# Patient Record
Sex: Female | Born: 1965
Health system: Southern US, Community
[De-identification: ages and names within clinical notes are randomized; demographics above are authoritative.]

## PROBLEM LIST (undated history)

## (undated) DIAGNOSIS — F32A Depression, unspecified: Secondary | ICD-10-CM

---

## 1998-08-09 ENCOUNTER — Emergency Department (HOSPITAL_COMMUNITY): Admission: EM | Admit: 1998-08-09 | Discharge: 1998-08-09 | Payer: Self-pay | Admitting: Emergency Medicine

## 2000-04-10 ENCOUNTER — Other Ambulatory Visit: Admission: RE | Admit: 2000-04-10 | Discharge: 2000-04-10 | Payer: Self-pay | Admitting: Obstetrics and Gynecology

## 2001-10-22 ENCOUNTER — Other Ambulatory Visit: Admission: RE | Admit: 2001-10-22 | Discharge: 2001-10-22 | Payer: Self-pay | Admitting: Obstetrics and Gynecology

## 2002-03-08 ENCOUNTER — Encounter (HOSPITAL_COMMUNITY): Admission: RE | Admit: 2002-03-08 | Discharge: 2002-03-15 | Payer: Self-pay | Admitting: Obstetrics and Gynecology

## 2002-03-22 ENCOUNTER — Encounter (HOSPITAL_COMMUNITY): Admission: RE | Admit: 2002-03-22 | Discharge: 2002-03-31 | Payer: Self-pay | Admitting: Obstetrics and Gynecology

## 2002-04-01 ENCOUNTER — Inpatient Hospital Stay (HOSPITAL_COMMUNITY): Admission: AD | Admit: 2002-04-01 | Discharge: 2002-04-05 | Payer: Self-pay | Admitting: Obstetrics and Gynecology

## 2002-04-02 ENCOUNTER — Encounter (INDEPENDENT_AMBULATORY_CARE_PROVIDER_SITE_OTHER): Payer: Self-pay

## 2002-05-02 ENCOUNTER — Other Ambulatory Visit: Admission: RE | Admit: 2002-05-02 | Discharge: 2002-05-02 | Payer: Self-pay | Admitting: Obstetrics and Gynecology

## 2004-12-10 ENCOUNTER — Other Ambulatory Visit: Admission: RE | Admit: 2004-12-10 | Discharge: 2004-12-10 | Payer: Self-pay | Admitting: Obstetrics and Gynecology

## 2005-02-15 ENCOUNTER — Ambulatory Visit (HOSPITAL_COMMUNITY): Admission: RE | Admit: 2005-02-15 | Discharge: 2005-02-15 | Payer: Self-pay

## 2006-01-05 ENCOUNTER — Other Ambulatory Visit: Admission: RE | Admit: 2006-01-05 | Discharge: 2006-01-05 | Payer: Self-pay | Admitting: Obstetrics and Gynecology

## 2007-03-09 ENCOUNTER — Ambulatory Visit: Payer: Self-pay | Admitting: Psychiatry

## 2007-03-09 ENCOUNTER — Inpatient Hospital Stay (HOSPITAL_COMMUNITY): Admission: RE | Admit: 2007-03-09 | Discharge: 2007-03-10 | Payer: Self-pay | Admitting: Psychiatry

## 2007-03-09 ENCOUNTER — Emergency Department (HOSPITAL_COMMUNITY): Admission: EM | Admit: 2007-03-09 | Discharge: 2007-03-09 | Payer: Self-pay | Admitting: Emergency Medicine

## 2007-03-11 ENCOUNTER — Ambulatory Visit (HOSPITAL_COMMUNITY): Payer: Self-pay | Admitting: *Deleted

## 2007-04-08 ENCOUNTER — Ambulatory Visit (HOSPITAL_COMMUNITY): Payer: Self-pay | Admitting: *Deleted

## 2007-04-16 ENCOUNTER — Ambulatory Visit (HOSPITAL_COMMUNITY): Payer: Self-pay | Admitting: Marriage and Family Therapist

## 2007-04-22 ENCOUNTER — Ambulatory Visit (HOSPITAL_COMMUNITY): Payer: Self-pay | Admitting: Marriage and Family Therapist

## 2007-04-30 ENCOUNTER — Ambulatory Visit (HOSPITAL_COMMUNITY): Payer: Self-pay | Admitting: Marriage and Family Therapist

## 2007-05-11 ENCOUNTER — Ambulatory Visit (HOSPITAL_COMMUNITY): Payer: Self-pay | Admitting: *Deleted

## 2007-05-12 ENCOUNTER — Ambulatory Visit (HOSPITAL_COMMUNITY): Payer: Self-pay | Admitting: Marriage and Family Therapist

## 2007-05-26 ENCOUNTER — Ambulatory Visit (HOSPITAL_COMMUNITY): Payer: Self-pay | Admitting: Marriage and Family Therapist

## 2007-06-09 ENCOUNTER — Ambulatory Visit (HOSPITAL_COMMUNITY): Payer: Self-pay | Admitting: Marriage and Family Therapist

## 2007-07-29 ENCOUNTER — Ambulatory Visit (HOSPITAL_COMMUNITY): Payer: Self-pay | Admitting: Marriage and Family Therapist

## 2007-08-05 ENCOUNTER — Ambulatory Visit (HOSPITAL_COMMUNITY): Payer: Self-pay | Admitting: Marriage and Family Therapist

## 2007-08-17 ENCOUNTER — Ambulatory Visit (HOSPITAL_COMMUNITY): Payer: Self-pay | Admitting: *Deleted

## 2007-08-23 ENCOUNTER — Ambulatory Visit (HOSPITAL_COMMUNITY): Payer: Self-pay | Admitting: Marriage and Family Therapist

## 2007-08-30 ENCOUNTER — Ambulatory Visit (HOSPITAL_COMMUNITY): Payer: Self-pay | Admitting: Marriage and Family Therapist

## 2007-09-07 ENCOUNTER — Ambulatory Visit (HOSPITAL_COMMUNITY): Payer: Self-pay | Admitting: Marriage and Family Therapist

## 2007-09-21 ENCOUNTER — Ambulatory Visit (HOSPITAL_COMMUNITY): Payer: Self-pay | Admitting: Marriage and Family Therapist

## 2010-07-16 NOTE — Discharge Summary (Signed)
NAMENELIDA, MANDARINO NO.:  [id number]   MEDICAL RECORD NO.:  [id number]          PATIENT TYPE:  IPS   LOCATION:  0502                          FACILITY:  [facility]   PHYSICIAN:  Jasmine Pang, M.D. DATE OF BIRTH:  [birth year]   DATE OF ADMISSION:  03/09/2007  DATE OF DISCHARGE:  03/10/2007                               DISCHARGE SUMMARY   IDENTIFICATION:  This is a 45 year old married white female from  [place] who was admitted on a voluntary basis on March 09, 2007.   HISTORY OF PRESENT ILLNESS:  The patient described herself as being a  frustrated mother of a 74-year-old.  She states she had made a comment  that she could hurt her son, but states she would not do this.  She  feels her mother has overreacted and wanted her to be in the hospital.  Her mother feels she has probable bipolar disorder.  She denies she has  suicidal ideation or homicidal ideation.  She denies she would hurt her  son in any way.  She states she thought she was coming over to our unit  for parenting classes and has been very upset to see the severity of  pathology here.   PAST PSYCHIATRIC HISTORY:  None recently.  She did have a psychiatrist  when she was a young adult, revolving around conflict with her mother.   PAST MEDICAL HISTORY:  No acute medical problems.  She does state since  her C-section 4 years ago she has not been having regular periods, and  she wonders if she is going through menopause.   MEDICATIONS:  None.   ALLERGIES:  No known drug allergies.   SOCIAL HISTORY:  The patient is 45 years old as indicated above.  She  has been married for 11 years.  She had been dating her current husband  for 5 years before they married.  She states they have had a somewhat  conflicted relationship recently because he found out she was using  drugs and was upset about this.  She admits that she has been using some  alcohol and some marijuana.  She smokes less than a pack per day,  she  says.  She denies any other drug use.   FAMILY HISTORY:  Positive for depression and anxiety in several family  members on her mother's side.   PHYSICAL FINDINGS:  There were no acute physical or medical problems  noted.  Her physical exam was done at [facility] ED.  Temperature was  98.4, pulse 74, respirations 18, and blood pressure 134/85.  She is 5  feet 7 inches and weighs 127 pounds.   ADMISSION LABORATORY:  CBC was within normal limits.  Alcohol level was  less than 5.  CMET was within normal limits.   HOSPITAL COURSE:  As indicated above, the patient denied that she would  hurt her child or herself.  She states this was an overexaggerated  remark that had been taking out of context.  We did report her remark to  CPS due to its nature.  I did  not feel, however, that she was at risk of  harming her child or anyone.  Her mental status was improved from  admission status.  She was less depressed and less anxious.  There was  no suicidal or homicidal ideation.  No thoughts of self-injurious  behavior.  No auditory or visual hallucinations.  No paranoia or  delusions.  Thoughts were logical and goal-directed.  Thought content,  no predominant theme.  Cognitive was grossly within normal limits.  It  was felt the patient was safe to be discharged home.  She will see me in  the psychiatric clinic at [facility] tomorrow for continued evaluation  and possible medication management.  She was not started on any  medication here.   DISCHARGE DIAGNOSES:  AXIS I:  Mood disorder, not otherwise specified.  Rule out polysubstance abuse.  AXIS II:  None.  AXIS III:  None.  AXIS IV:  Moderate (problems with primary support group and burden of  psychiatric illness).  AXIS V:  Global assessment of functioning upon discharge was 50.  Global  assessment of functioning upon admission was 40.  Global assessment of  functioning highest past year was 60-65.   POST-HOSPITAL CARE PLANS:  There  are no specific activity level or  dietary restrictions.   POST-HOSPITAL APPOINTMENTS:  The patient will see me in the [facility] tomorrow (March 11, 2007) at 1:30  p.m. for further evaluation and treatment.   DISCHARGE MEDICATIONS:  None.      Jasmine Pang, M.D.  Electronically Signed     BHS/MEDQ  D:  03/10/2007  T:  03/11/2007  Job:  [id number]

## 2010-07-19 NOTE — Discharge Summary (Signed)
   [name], [name]                             ACCOUNT NO.:  [id number]   MEDICAL RECORD NO.:  [id number]                   PATIENT TYPE:  INP   LOCATION:  9117                                 FACILITY:  [facility]   PHYSICIAN:  Rudy Jew. Ashley Royalty, M.D.             DATE OF BIRTH:  1965-04-30   DATE OF ADMISSION:  04/02/2002  DATE OF DISCHARGE:  04/05/2002                                 DISCHARGE SUMMARY   DISCHARGE DIAGNOSES:  1. Intrauterine pregnancy at 39+ weeks gestation.  2. Premature rupture of membranes.  3. Smoker.  4. Chorioamnionitis.  5. Nonreassuring fetal heart tracing, necessitating cesarean section.  6. Anemia - probably secondary to blood loss.   OPERATIONS/SPECIAL PROCEDURES:  Primary low-transverse cesarean section.   CONSULTATIONS:  None.   DISCHARGE MEDICATIONS:  1. Percocet.  2. Hydrochlorothiazide.   HISTORY AND PHYSICAL:  This is a 45 year old gravida 2, para 0, AB 1 at 39+  weeks gestation.  Prenatal care was complicated by smoking.  The patient  presented to maternity admissions complaining of premature rupture of  membranes.   HOSPITAL COURSE:  The patient was admitted to [facility] in  [place].  Admission laboratory studies were drawn.  Initial cervical  examination revealed the cervix to be 2 cm dilated, 80% effaced, -1 station  vertex presentation.  The patient went on to labor.  She developed  chorioamnionitis and a nonreassuring fetal heart tracing.  Hence, she was  taken to the operating room and underwent primary low-transverse cesarean  section.  The procedure yielded a 6-pound-2-ounce female, Apgars 9 at 1 minute  and 9 at 5 minutes, sent the newborn nursery.  Delivery of newborn was by  Dr. Lonell Face.  The patient's postoperative course was benign.  She was  discharged on the third postpartum day afebrile, in a satisfactory  condition.   ESSENTIAL CLINICAL FINDINGS:  Hemoglobin and hematocrit on admission were  11.9 and 36.2,  respectively.  Repeat values obtained on 04/05/2002 were 7.6  and 22.9, respectively.  These latter values represented a rise from the  nadir two days before of 7.4 and 22.8, respectively.   DISPOSITION:  The patient is to return to [facility] in four to six  weeks for postpartum evaluation.                                               James A. Ashley Royalty, M.D.    JAM/MEDQ  D:  06/16/2002  T:  06/16/2002  Job:  [id number]

## 2010-07-19 NOTE — Op Note (Signed)
Ebony Wolfe, Ebony Wolfe                             ACCOUNT NO.:  [id number]   MEDICAL RECORD NO.:  [id number]                   PATIENT TYPE:  INP   LOCATION:  9117                                 [facility]   PHYSICIAN:  Rudy Jew. Ashley Royalty, M.D.             DATE OF BIRTH:  31-Mar-1965   DATE OF PROCEDURE:  04/02/2002  DATE OF DISCHARGE:                                 OPERATIVE REPORT   PREOPERATIVE DIAGNOSES:  1. Intrauterine pregnancy at term.  2. Premature rupture of membranes.  3. Chorioamnionitis.  4. Nonreassuring fetal heart tracing.   POSTOPERATIVE DIAGNOSES:  1. Intrauterine pregnancy at term.  2. Premature rupture of membranes.  3. Chorioamnionitis.  4. Nonreassuring fetal heart tracing.   PROCEDURE:  Primary low transverse cesarean section.   SURGEON:  Rudy Jew. Ashley Royalty, M.D.   ANESTHESIA:  Epidural.   FINDINGS:  A 6-pound 2-ounce female, Apgars 9 at one minute and 9 at five  minutes, sent to the newborn nursery.  Arterial cord pH 7.34.   ESTIMATED BLOOD LOSS:  800 cc.   COMPLICATIONS:  None.   PACKS AND DRAINS:  Foley.  Sponge, instrument, and needle counts reported as  correct x2.   PROCEDURE:  The patient was taken to the operating room and placed in the  dorsal supine position.  She was prepped and draped in the usual manner for  abdominal surgery.  A Foley catheter had been previously placed.  After  surgical levels of epidural anesthetic were verified, a Pfannenstiel  incision was made down to the level of the fascia.  The fascia was nicked  with a knife and incised transversely with Mayo scissors.  The underlying  rectus muscles were separated from the overlying fascia using sharp and  blunt dissection.  The rectus muscles were separated in the midline exposing  the peritoneum which was elevated with hemostats and entered atraumatically  with Metzenbaum scissors.  The incision was extended longitudinally.  The  uterus was identified and a bladder flap  created downsizing the anterior  uterine serosa and sharply and bluntly dissecting the bladder inferiorly.  It was held in place with a bladder blade.  The uterus was then entered  through a low transverse incision using sharp and blunt dissection.  The  amniotic fluid was noted to be clear.  The infant was delivered from the  vertex presentation in an atraumatic manner.  The infant was suctioned.  The  cord was triply clamped, cut, and the infant given immediately to the  awaiting pediatrics team.  There was no evidence of any nuchal cord.  Arterial cord pH was obtained from an isolated segment.  Regular cord blood  was obtained and placenta membranes were removed in their entirety and  submitted to pathology for histologic studies.  The uterus was then  exteriorized.  The uterus was then closed in two running layers of 1 Vicryl.  The first was running, locking layer.  The second was a running,  intermittently locking, and imbricating layer.  Hemostasis was noted.  The  uterus, fallopian tubes, and ovaries were inspected and found to be  otherwise normal.  They were returned to the abdominal cavity.  Copious  irrigation was accomplished.  Hemostasis was noted.  The fascia was then  closed with 0 Vicryl in a running fashion.  The skin was closed with  staples.  The patient tolerated the procedure extremely well and was  returned to the recovery room in good condition.                                               James A. Ashley Royalty, M.D.    JAM/MEDQ  D:  04/02/2002  T:  04/02/2002  Job:  [id number]

## 2010-11-21 LAB — CBC
HCT: 38
Hemoglobin: 12.8
MCHC: 33.6
MCV: 91.6
Platelets: 164
RBC: 4.15
RDW: 13.6
WBC: 7.8

## 2010-11-21 LAB — PREGNANCY, URINE: Preg Test, Ur: NEGATIVE

## 2010-11-21 LAB — RAPID URINE DRUG SCREEN, HOSP PERFORMED
Amphetamines: NOT DETECTED
Barbiturates: NOT DETECTED
Benzodiazepines: NOT DETECTED
Cocaine: NOT DETECTED
Opiates: NOT DETECTED
Tetrahydrocannabinol: POSITIVE — AB

## 2010-11-21 LAB — COMPREHENSIVE METABOLIC PANEL WITH GFR
ALT: 14
AST: 20
Albumin: 4
Alkaline Phosphatase: 78
BUN: 8
CO2: 30
Calcium: 9.5
Chloride: 106
Creatinine, Ser: 0.83
GFR calc Af Amer: >60
GFR calc non Af Amer: >60
Glucose, Bld: 89
Potassium: 4.2
Sodium: 143
Total Bilirubin: 1
Total Protein: 6.8

## 2010-11-21 LAB — ETHANOL: Alcohol, Ethyl (B): <5

## 2012-02-25 ENCOUNTER — Emergency Department (HOSPITAL_COMMUNITY)
Admission: EM | Admit: 2012-02-25 | Discharge: 2012-02-26 | Disposition: A | Discharge status: Home / Self Care | Payer: Self-pay | Attending: Emergency Medicine | Admitting: Emergency Medicine

## 2012-02-25 DIAGNOSIS — Z3202 Encounter for pregnancy test, result negative: Secondary | ICD-10-CM | POA: Insufficient documentation

## 2012-02-25 DIAGNOSIS — R55 Syncope and collapse: Secondary | ICD-10-CM | POA: Insufficient documentation

## 2012-02-25 NOTE — ED Notes (Addendum)
EMS called to home.  Found patient laying after un-witnessed syncopal episode out at her parents house. Patient denies any head pain.

## 2012-02-26 LAB — CBC WITH DIFFERENTIAL/PLATELET
Basophils Relative: 0 % (ref 0–1)
Eosinophils Absolute: 0.2 10*3/uL (ref 0.0–0.7)
Eosinophils Relative: 2 % (ref 0–5)
Hemoglobin: 13.7 g/dL (ref 12.0–15.0)
Lymphs Abs: 2.7 10*3/uL (ref 0.7–4.0)
MCH: 29.8 pg (ref 26.0–34.0)
MCHC: 32.5 g/dL (ref 30.0–36.0)
MCV: 91.7 fL (ref 78.0–100.0)
Monocytes Relative: 7 % (ref 3–12)
RBC: 4.6 MIL/uL (ref 3.87–5.11)

## 2012-02-26 LAB — URINALYSIS, ROUTINE W REFLEX MICROSCOPIC
Bilirubin Urine: NEGATIVE
Hgb urine dipstick: NEGATIVE
Ketones, ur: NEGATIVE mg/dL
Specific Gravity, Urine: 1.028 (ref 1.005–1.030)
Urobilinogen, UA: 1 mg/dL (ref 0.0–1.0)
pH: 7 (ref 5.0–8.0)

## 2012-02-26 LAB — BASIC METABOLIC PANEL
BUN: 19 mg/dL (ref 6–23)
Calcium: 9.6 mg/dL (ref 8.4–10.5)
Creatinine, Ser: 0.96 mg/dL (ref 0.50–1.10)
GFR calc non Af Amer: 71 mL/min — ABNORMAL LOW (ref >90)
Glucose, Bld: 91 mg/dL (ref 70–99)

## 2012-02-26 NOTE — ED Notes (Signed)
Patient is alert and oriented x3.  She was given DC instructions and follow up visit instructions.  Patient gave verbal understanding. She was DC ambulatory under her own power to home.  V/S stable.  He was not showing any signs of distress on DC 

## 2012-02-26 NOTE — ED Provider Notes (Signed)
History     CSN: [id number]  Arrival date & time 02/25/12  2355   First MD Initiated Contact with Patient 02/26/12 0019      Chief Complaint  Patient presents with  . Loss of Consciousness   HPI  History provided by the patient and family. Patient is a 46 year old female with no significant PMH who is visiting for the holidays. Patient reports that she began feeling warm and hot and stood up to walk to get a wet wash rate for her face. As she was serological interface she had a brief syncopal episode witnessed by family. Family reports that patient comfortable to the ground and did not respond being called twice. Family ran to get his grandmother and patient was coming around at that time. There was no reports of extremity movements or shaking. There was no urinary or fecal incontinence. There was no post syncopal confusion. Patient currently reports feeling well. Denies feeling any pain or reports of injury. She denied having any chest pain prior to episodes or shortness of breath. She does report having some alcoholic beverages earlier in the evening.    No past medical history on file.  No past surgical history on file.  No family history on file.  History  Substance Use Topics  . Smoking status: Not on file  . Smokeless tobacco: Not on file  . Alcohol Use: Not on file    OB History    No data available      Review of Systems  Constitutional: Negative for fever and chills.  Respiratory: Negative for cough and shortness of breath.   Cardiovascular: Negative for palpitations.  Gastrointestinal: Negative for nausea, vomiting and abdominal pain.  Neurological: Negative for light-headedness and headaches.  All other systems reviewed and are negative.    Allergies  Review of patient's allergies indicates no known allergies.  Home Medications   Current Outpatient Rx  Name  Route  Sig  Dispense  Refill  . FERROUS SULFATE 325 (65 FE) MG PO TABS   Oral   Take 325 mg  by mouth daily with breakfast.           BP 102/59  Pulse 74  Temp 97.8 F (36.6 C) (Oral)  Resp 15  SpO2 96%  Physical Exam  Nursing note and vitals reviewed. Constitutional: She is oriented to person, place, and time. She appears well-developed and well-nourished. No distress.  HENT:  Head: Normocephalic and atraumatic.  Eyes: Conjunctivae normal and EOM are normal. Pupils are equal, round, and reactive to light.  Neck: Normal range of motion. Neck supple.       No meningeal signs  Cardiovascular: Normal rate and regular rhythm.   No murmur heard. Pulmonary/Chest: Effort normal and breath sounds normal. No respiratory distress. She has no wheezes. She has no rales.  Abdominal: Soft. There is no tenderness. There is no rigidity, no rebound, no guarding, no CVA tenderness and no tenderness at McBurney's point.  Neurological: She is alert and oriented to person, place, and time. She has normal strength. No cranial nerve deficit or sensory deficit. Gait normal.  Skin: Skin is warm and dry. No rash noted.  Psychiatric: She has a normal mood and affect. Her behavior is normal.    ED Course  Procedures   Results for orders placed during the hospital encounter of 02/25/12  URINALYSIS, ROUTINE W REFLEX MICROSCOPIC      Component Value Range   Color, Urine YELLOW  YELLOW   APPearance CLOUDY (*)  CLEAR   Specific Gravity, Urine 1.028  1.005 - 1.030   pH 7.0  5.0 - 8.0   Glucose, UA NEGATIVE  NEGATIVE mg/dL   Hgb urine dipstick NEGATIVE  NEGATIVE   Bilirubin Urine NEGATIVE  NEGATIVE   Ketones, ur NEGATIVE  NEGATIVE mg/dL   Protein, ur NEGATIVE  NEGATIVE mg/dL   Urobilinogen, UA 1.0  0.0 - 1.0 mg/dL   Nitrite NEGATIVE  NEGATIVE   Leukocytes, UA NEGATIVE  NEGATIVE  CBC WITH DIFFERENTIAL      Component Value Range   WBC 9.6  4.0 - 10.5 K/uL   RBC 4.60  3.87 - 5.11 MIL/uL   Hemoglobin 13.7  12.0 - 15.0 g/dL   HCT 16.1  09.6 - 04.5 %   MCV 91.7  78.0 - 100.0 fL   MCH 29.8  26.0  - 34.0 pg   MCHC 32.5  30.0 - 36.0 g/dL   RDW 40.9  81.1 - 91.4 %   Platelets 177  150 - 400 K/uL   Neutrophils Relative 63  43 - 77 %   Neutro Abs 6.1  1.7 - 7.7 K/uL   Lymphocytes Relative 28  12 - 46 %   Lymphs Abs 2.7  0.7 - 4.0 K/uL   Monocytes Relative 7  3 - 12 %   Monocytes Absolute 0.7  0.1 - 1.0 K/uL   Eosinophils Relative 2  0 - 5 %   Eosinophils Absolute 0.2  0.0 - 0.7 K/uL   Basophils Relative 0  0 - 1 %   Basophils Absolute 0.0  0.0 - 0.1 K/uL  BASIC METABOLIC PANEL      Component Value Range   Sodium 140  135 - 145 mEq/L   Potassium 3.9  3.5 - 5.1 mEq/L   Chloride 103  96 - 112 mEq/L   CO2 26  19 - 32 mEq/L   Glucose, Bld 91  70 - 99 mg/dL   BUN 19  6 - 23 mg/dL   Creatinine, Ser 7.82  0.50 - 1.10 mg/dL   Calcium 9.6  8.4 - 95.6 mg/dL   GFR calc non Af Amer 71 (*) >90 mL/min   GFR calc Af Amer 82 (*) >90 mL/min  POCT PREGNANCY, URINE      Component Value Range   Preg Test, Ur NEGATIVE  NEGATIVE       1. Syncope       MDM  Patient seen and evaluated. Patient well-appearing without symptoms at this time. Denies any pain or injuries. Denies headache. Normal nonfocal neuro exam.   Labs and unremarkable. EKG also normal today. Symptoms were consistent with vasovagal type syncope with episode. Patient did report some alcohol use earlier in the evening. Patient not found to be significant orthostatic. I did discuss with patient options for further evaluation including CT scan however she has normal nonfocal neuro exam. No symptoms of headache. Low suspicion for [facility] or subdural head is atraumatic. Patient does not wish to have any further testing and is requesting to return home.       Date: 02/26/2012  Rate: 64  Rhythm: normal sinus rhythm  QRS Axis: normal  Intervals: normal  ST/T Wave abnormalities: normal  Conduction Disutrbances:none  Narrative Interpretation:   Old EKG Reviewed: none available    Angus Seller, [place] 02/26/12 2031

## 2012-03-02 NOTE — ED Provider Notes (Signed)
Medical screening examination/treatment/procedure(s) were performed by non-physician practitioner and as supervising physician I was immediately available for consultation/collaboration. Devoria Albe, MD, Armando Gang   Ward Givens, MD 03/02/12 [phone]

## 2017-08-25 ENCOUNTER — Ambulatory Visit (INDEPENDENT_AMBULATORY_CARE_PROVIDER_SITE_OTHER): Payer: Self-pay | Admitting: Nurse Practitioner

## 2017-08-25 VITALS — BP 90/65 | HR 55 | Temp 97.6°F | Resp 16 | Wt 124.6 lb

## 2017-08-25 DIAGNOSIS — Z0000 Encounter for general adult medical examination without abnormal findings: Secondary | ICD-10-CM

## 2017-08-25 NOTE — Patient Instructions (Addendum)
Health Maintenance, Female Adopting a healthy lifestyle and getting preventive care can go a long way to promote health and wellness. Talk with your health care provider about what schedule of regular examinations is right for you. This is a good chance for you to check in with your provider about disease prevention and staying healthy. In between checkups, there are plenty of things you can do on your own. Experts have done a lot of research about which lifestyle changes and preventive measures are most likely to keep you healthy. Ask your health care provider for more information. Weight and diet Eat a healthy diet  Be sure to include plenty of vegetables, fruits, low-fat dairy products, and lean protein.  Do not eat a lot of foods high in solid fats, added sugars, or salt.  Get regular exercise. This is one of the most important things you can do for your health. ? Most adults should exercise for at least 150 minutes each week. The exercise should increase your heart rate and make you sweat (moderate-intensity exercise). ? Most adults should also do strengthening exercises at least twice a week. This is in addition to the moderate-intensity exercise.  Maintain a healthy weight  Body mass index (BMI) is a measurement that can be used to identify possible weight problems. It estimates body fat based on height and weight. Your health care provider can help determine your BMI and help you achieve or maintain a healthy weight.  For females 20 years of age and older: ? A BMI below 18.5 is considered underweight. ? A BMI of 18.5 to 24.9 is normal. ? A BMI of 25 to 29.9 is considered overweight. ? A BMI of 30 and above is considered obese.  Watch levels of cholesterol and blood lipids  You should start having your blood tested for lipids and cholesterol at 50 years of age, then have this test every 5 years.  You may need to have your cholesterol levels checked more often if: ? Your lipid or  cholesterol levels are high. ? You are older than 50 years of age. ? You are at high risk for heart disease.  Cancer screening Lung Cancer  Lung cancer screening is recommended for adults 55-80 years old who are at high risk for lung cancer because of a history of smoking.  A yearly low-dose CT scan of the lungs is recommended for people who: ? Currently smoke. ? Have quit within the past 15 years. ? Have at least a 30-pack-year history of smoking. A pack year is smoking an average of one pack of cigarettes a day for 1 year.  Yearly screening should continue until it has been 15 years since you quit.  Yearly screening should stop if you develop a health problem that would prevent you from having lung cancer treatment.  Breast Cancer  Practice breast self-awareness. This means understanding how your breasts normally appear and feel.  It also means doing regular breast self-exams. Let your health care provider know about any changes, no matter how small.  If you are in your 20s or 30s, you should have a clinical breast exam (CBE) by a health care provider every 1-3 years as part of a regular health exam.  If you are 40 or older, have a CBE every year. Also consider having a breast X-ray (mammogram) every year.  If you have a family history of breast cancer, talk to your health care provider about genetic screening.  If you are at high risk   for breast cancer, talk to your health care provider about having an MRI and a mammogram every year.  Breast cancer gene (BRCA) assessment is recommended for women who have family members with BRCA-related cancers. BRCA-related cancers include: ? Breast. ? Ovarian. ? Tubal. ? Peritoneal cancers.  Results of the assessment will determine the need for genetic counseling and BRCA1 and BRCA2 testing.  Cervical Cancer Your health care provider may recommend that you be screened regularly for cancer of the pelvic organs (ovaries, uterus, and  vagina). This screening involves a pelvic examination, including checking for microscopic changes to the surface of your cervix (Pap test). You may be encouraged to have this screening done every 3 years, beginning at age 75.  For women ages 17-65, health care providers may recommend pelvic exams and Pap testing every 3 years, or they may recommend the Pap and pelvic exam, combined with testing for human papilloma virus (HPV), every 5 years. Some types of HPV increase your risk of cervical cancer. Testing for HPV may also be done on women of any age with unclear Pap test results.  Other health care providers may not recommend any screening for nonpregnant women who are considered low risk for pelvic cancer and who do not have symptoms. Ask your health care provider if a screening pelvic exam is right for you.  If you have had past treatment for cervical cancer or a condition that could lead to cancer, you need Pap tests and screening for cancer for at least 20 years after your treatment. If Pap tests have been discontinued, your risk factors (such as having a new sexual partner) need to be reassessed to determine if screening should resume. Some women have medical problems that increase the chance of getting cervical cancer. In these cases, your health care provider may recommend more frequent screening and Pap tests.  Colorectal Cancer  This type of cancer can be detected and often prevented.  Routine colorectal cancer screening usually begins at 50 years of age and continues through 50 years of age.  Your health care provider may recommend screening at an earlier age if you have risk factors for colon cancer.  Your health care provider may also recommend using home test kits to check for hidden blood in the stool.  A small camera at the end of a tube can be used to examine your colon directly (sigmoidoscopy or colonoscopy). This is done to check for the earliest forms of colorectal  cancer.  Routine screening usually begins at age 37.  Direct examination of the colon should be repeated every 5-10 years through 50 years of age. However, you may need to be screened more often if early forms of precancerous polyps or small growths are found.  Skin Cancer  Check your skin from head to toe regularly.  Tell your health care provider about any new moles or changes in moles, especially if there is a change in a mole's shape or color.  Also tell your health care provider if you have a mole that is larger than the size of a pencil eraser.  Always use sunscreen. Apply sunscreen liberally and repeatedly throughout the day.  Protect yourself by wearing long sleeves, pants, a wide-brimmed hat, and sunglasses whenever you are outside.  Heart disease, diabetes, and high blood pressure  High blood pressure causes heart disease and increases the risk of stroke. High blood pressure is more likely to develop in: ? People who have blood pressure in the high end of  the normal range (130-139/85-89 mm Hg). ? People who are overweight or obese. ? People who are African American.  If you are 80-89 years of age, have your blood pressure checked every 3-5 years. If you are 43 years of age or older, have your blood pressure checked every year. You should have your blood pressure measured twice-once when you are at a hospital or clinic, and once when you are not at a hospital or clinic. Record the average of the two measurements. To check your blood pressure when you are not at a hospital or clinic, you can use: ? An automated blood pressure machine at a pharmacy. ? A home blood pressure monitor.  If you are between 36 years and 47 years old, ask your health care provider if you should take aspirin to prevent strokes.  Have regular diabetes screenings. This involves taking a blood sample to check your fasting blood sugar level. ? If you are at a normal weight and have a low risk for diabetes,  have this test once every three years after 50 years of age. ? If you are overweight and have a high risk for diabetes, consider being tested at a younger age or more often. Preventing infection Hepatitis B  If you have a higher risk for hepatitis B, you should be screened for this virus. You are considered at high risk for hepatitis B if: ? You were born in a country where hepatitis B is common. Ask your health care provider which countries are considered high risk. ? Your parents were born in a high-risk country, and you have not been immunized against hepatitis B (hepatitis B vaccine). ? You have HIV or AIDS. ? You use needles to inject street drugs. ? You live with someone who has hepatitis B. ? You have had sex with someone who has hepatitis B. ? You get hemodialysis treatment. ? You take certain medicines for conditions, including cancer, organ transplantation, and autoimmune conditions.  Hepatitis C  Blood testing is recommended for: ? Everyone born from [birth year] through 1963. ? Anyone with known risk factors for hepatitis C.  Sexually transmitted infections (STIs)  You should be screened for sexually transmitted infections (STIs) including gonorrhea and chlamydia if: ? You are sexually active and are younger than 50 years of age. ? You are older than 50 years of age and your health care provider tells you that you are at risk for this type of infection. ? Your sexual activity has changed since you were last screened and you are at an increased risk for chlamydia or gonorrhea. Ask your health care provider if you are at risk.  If you do not have HIV, but are at risk, it may be recommended that you take a prescription medicine daily to prevent HIV infection. This is called pre-exposure prophylaxis (PrEP). You are considered at risk if: ? You are sexually active and do not regularly use condoms or know the HIV status of your partner(s). ? You take drugs by injection. ? You are  sexually active with a partner who has HIV.  Talk with your health care provider about whether you are at high risk of being infected with HIV. If you choose to begin PrEP, you should first be tested for HIV. You should then be tested every 3 months for as long as you are taking PrEP. Pregnancy  If you are premenopausal and you may become pregnant, ask your health care provider about preconception counseling.  If you may become  pregnant, take 400 to 800 micrograms (mcg) of folic acid every day.  If you want to prevent pregnancy, talk to your health care provider about birth control (contraception). Osteoporosis and menopause  Osteoporosis is a disease in which the bones lose minerals and strength with aging. This can result in serious bone fractures. Your risk for osteoporosis can be identified using a bone density scan.  If you are 39 years of age or older, or if you are at risk for osteoporosis and fractures, ask your health care provider if you should be screened.  Ask your health care provider whether you should take a calcium or vitamin D supplement to lower your risk for osteoporosis.  Menopause may have certain physical symptoms and risks.  Hormone replacement therapy may reduce some of these symptoms and risks. Talk to your health care provider about whether hormone replacement therapy is right for you. Follow these instructions at home:  Schedule regular health, dental, and eye exams.  Stay current with your immunizations.  Do not use any tobacco products including cigarettes, chewing tobacco, or electronic cigarettes.  If you are pregnant, do not drink alcohol.  If you are breastfeeding, limit how much and how often you drink alcohol.  Limit alcohol intake to no more than 1 drink per day for nonpregnant women. One drink equals 12 ounces of beer, 5 ounces of wine, or 1 ounces of hard liquor.  Do not use street drugs.  Do not share needles.  Ask your health care  provider for help if you need support or information about quitting drugs.  Tell your health care provider if you often feel depressed.  Tell your health care provider if you have ever been abused or do not feel safe at home. This information is not intended to replace advice given to you by your health care provider. Make sure you discuss any questions you have with your health care provider. Document Released: 09/02/2010 Document Revised: 07/26/2015 Document Reviewed: 11/21/2014 Elsevier Interactive Patient Education  2018 [place] Years, Female Preventive care refers to lifestyle choices and visits with your health care provider that can promote health and wellness. What does preventive care include?  A yearly physical exam. This is also called an annual well check.  Dental exams once or twice a year.  Routine eye exams. Ask your health care provider how often you should have your eyes checked.  Personal lifestyle choices, including: ? Daily care of your teeth and gums. ? Regular physical activity. ? Eating a healthy diet. ? Avoiding tobacco and drug use. ? Limiting alcohol use. ? Practicing safe sex. ? Taking low-dose aspirin daily starting at age 34. ? Taking vitamin and mineral supplements as recommended by your health care provider. What happens during an annual well check? The services and screenings done by your health care provider during your annual well check will depend on your age, overall health, lifestyle risk factors, and family history of disease. Counseling Your health care provider may ask you questions about your:  Alcohol use.  Tobacco use.  Drug use.  Emotional well-being.  Home and relationship well-being.  Sexual activity.  Eating habits.  Work and work Statistician.  Method of birth control.  Menstrual cycle.  Pregnancy history.  Screening You may have the following tests or measurements:  Height, weight,  and BMI.  Blood pressure.  Lipid and cholesterol levels. These may be checked every 5 years, or more frequently if you are over 50 years  old.  Skin check.  Lung cancer screening. You may have this screening every year starting at age 101 if you have a 30-pack-year history of smoking and currently smoke or have quit within the past 15 years.  Fecal occult blood test (FOBT) of the stool. You may have this test every year starting at age 60.  Flexible sigmoidoscopy or colonoscopy. You may have a sigmoidoscopy every 5 years or a colonoscopy every 10 years starting at age 73.  Hepatitis C blood test.  Hepatitis B blood test.  Sexually transmitted disease (STD) testing.  Diabetes screening. This is done by checking your blood sugar (glucose) after you have not eaten for a while (fasting). You may have this done every 1-3 years.  Mammogram. This may be done every 1-2 years. Talk to your health care provider about when you should start having regular mammograms. This may depend on whether you have a family history of breast cancer.  BRCA-related cancer screening. This may be done if you have a family history of breast, ovarian, tubal, or peritoneal cancers.  Pelvic exam and Pap test. This may be done every 3 years starting at age 66. Starting at age 10, this may be done every 5 years if you have a Pap test in combination with an HPV test.  Bone density scan. This is done to screen for osteoporosis. You may have this scan if you are at high risk for osteoporosis.  Discuss your test results, treatment options, and if necessary, the need for more tests with your health care provider. Vaccines Your health care provider may recommend certain vaccines, such as:  Influenza vaccine. This is recommended every year.  Tetanus, diphtheria, and acellular pertussis (Tdap, Td) vaccine. You may need a Td booster every 10 years.  Varicella vaccine. You may need this if you have not been  vaccinated.  Zoster vaccine. You may need this after age 21.  Measles, mumps, and rubella (MMR) vaccine. You may need at least one dose of MMR if you were born in 1957 or later. You may also need a second dose.  Pneumococcal 13-valent conjugate (PCV13) vaccine. You may need this if you have certain conditions and were not previously vaccinated.  Pneumococcal polysaccharide (PPSV23) vaccine. You may need one or two doses if you smoke cigarettes or if you have certain conditions.  Meningococcal vaccine. You may need this if you have certain conditions.  Hepatitis A vaccine. You may need this if you have certain conditions or if you travel or work in places where you may be exposed to hepatitis A.  Hepatitis B vaccine. You may need this if you have certain conditions or if you travel or work in places where you may be exposed to hepatitis B.  Haemophilus influenzae type b (Hib) vaccine. You may need this if you have certain conditions.  Talk to your health care provider about which screenings and vaccines you need and how often you need them. This information is not intended to replace advice given to you by your health care provider. Make sure you discuss any questions you have with your health care provider. Document Released: 03/16/2015 Document Revised: 11/07/2015 Document Reviewed: 12/19/2014 Elsevier Interactive Patient Education  2018 [place].  Preventing High Cholesterol Cholesterol is a waxy, fat-like substance that your body needs in small amounts. Your liver makes all the cholesterol that your body needs. Having high cholesterol (hypercholesterolemia) increases your risk for heart disease and stroke. Extra (excess) cholesterol comes from the food you  eat, such as animal-based fat (saturated fat) from meat and some dairy products. High cholesterol can often be prevented with diet and lifestyle changes. If you already have high cholesterol, you can control it with diet and  lifestyle changes, as well as medicine. What nutrition changes can be made?  Eat less saturated fat. Foods that contain saturated fat include red meat and some dairy products.  Avoid processed meats, like bacon and lunch meats.  Avoid trans fats, which are found in margarine and some baked goods.  Avoid foods and beverages that have added sugars.  Eat more fruits, vegetables, and whole grains.  Choose healthy sources of protein, such as fish, poultry, and nuts.  Choose healthy sources of fat, such as: ? Nuts. ? Vegetable oils, especially olive oil. ? Fish that have healthy fats (omega-3 fatty acids), such as mackerel or salmon. What lifestyle changes can be made?  Lose weight if you are overweight. Losing 5-10 lb (2.3-4.5 kg) can help prevent or control high cholesterol and reduce your risk for diabetes and high blood pressure. Ask your health care provider to help you with a diet and exercise plan to safely lose weight.  Get enough exercise. Do at least 150 minutes of moderate-intensity exercise each week. ? You could do this in short exercise sessions several times a day, or you could do longer exercise sessions a few times a week. For example, you could take a brisk 10-minute walk or bike ride, 3 times a day, for 5 days a week.  Do not smoke. If you need help quitting, ask your health care provider.  Limit your alcohol intake. If you drink alcohol, limit alcohol intake to no more than 1 drink a day for nonpregnant women and 2 drinks a day for men. One drink equals 12 oz of beer, 5 oz of wine, or 1 oz of hard liquor. Why are these changes important? If you have high cholesterol, deposits (plaques) may build up on the walls of your blood vessels. Plaques make the arteries narrower and stiffer, which can restrict or block blood flow and cause blood clots to form. This greatly increases your risk for heart attack and stroke. Making diet and lifestyle changes can reduce your risk for  these life-threatening conditions. What can I do to lower my risk?  Manage your risk factors for high cholesterol. Talk with your health care provider about all of your risk factors and how to lower your risk.  Manage other conditions that you have, such as diabetes or high blood pressure (hypertension).  Have your cholesterol checked at regular intervals.  Keep all follow-up visits as told by your health care provider. This is important. How is this treated? In addition to diet and lifestyle changes, your health care provider may recommend medicines to help lower cholesterol, such as a medicine to reduce the amount of cholesterol made in your liver. You may need medicine if:  Diet and lifestyle changes do not lower your cholesterol enough.  You have high cholesterol and other risk factors for heart disease or stroke.  Take over-the-counter and prescription medicines only as told by your health care provider. Where to find more information:  American Heart Association: ThisTune.com.pt.jsp  National Heart, Lung, and Blood Institute: FrenchToiletries.com.cy Summary  High cholesterol increases your risk for heart disease and stroke. By keeping your cholesterol level low, you can reduce your risk for these conditions.  Diet and lifestyle changes are the most important steps in preventing high cholesterol.  Work  with your health care provider to manage your risk factors, and have your blood tested regularly. This information is not intended to replace advice given to you by your health care provider. Make sure you discuss any questions you have with your health care provider. Document Released: 03/04/2015 Document Revised: 10/27/2015 Document Reviewed: 10/27/2015 Elsevier Interactive Patient Education  2018 [place].  Preventing Hypertension Hypertension, commonly  called high blood pressure, is when the force of blood pumping through the arteries is too strong. Arteries are blood vessels that carry blood from the heart throughout the body. Over time, hypertension can damage the arteries and decrease blood flow to important parts of the body, including the brain, heart, and kidneys. Often, hypertension does not cause symptoms until blood pressure is very high. For this reason, it is important to have your blood pressure checked on a regular basis. Hypertension can often be prevented with diet and lifestyle changes. If you already have hypertension, you can control it with diet and lifestyle changes, as well as medicine. What nutrition changes can be made? Maintain a healthy diet. This includes:  Eating less salt (sodium). Ask your health care provider how much sodium is safe for you to have. The general recommendation is to consume less than 1 tsp (2,300 mg) of sodium a day. ? Do not add salt to your food. ? Choose low-sodium options when grocery shopping and eating out.  Limiting fats in your diet. You can do this by eating low-fat or fat-free dairy products and by eating less red meat.  Eating more fruits, vegetables, and whole grains. Make a goal to eat: ? 1-2 cups of fresh fruits and vegetables each day. ? 3-4 servings of whole grains each day.  Avoiding foods and beverages that have added sugars.  Eating fish that contain healthy fats (omega-3 fatty acids), such as mackerel or salmon.  If you need help putting together a healthy eating plan, try the DASH diet. This diet is high in fruits, vegetables, and whole grains. It is low in sodium, red meat, and added sugars. DASH stands for Dietary Approaches to Stop Hypertension. What lifestyle changes can be made?  Lose weight if you are overweight. Losing just 3?5% of your body weight can help prevent or control hypertension. ? For example, if your present weight is 200 lb (91 kg), a loss of 3-5% of your  weight means losing 6-10 lb (2.7-4.5 kg). ? Ask your health care provider to help you with a diet and exercise plan to safely lose weight.  Get enough exercise. Do at least 150 minutes of moderate-intensity exercise each week. ? You could do this in short exercise sessions several times a day, or you could do longer exercise sessions a few times a week. For example, you could take a brisk 10-minute walk or bike ride, 3 times a day, for 5 days a week.  Find ways to reduce stress, such as exercising, meditating, listening to music, or taking a yoga class. If you need help reducing stress, ask your health care provider.  Do not smoke. This includes e-cigarettes. Chemicals in tobacco and nicotine products raise your blood pressure each time you smoke. If you need help quitting, ask your health care provider.  Avoid alcohol. If you drink alcohol, limit alcohol intake to no more than 1 drink a day for nonpregnant women and 2 drinks a day for men. One drink equals 12 oz of beer, 5 oz of wine, or 1 oz of hard liquor. Why  are these changes important? Diet and lifestyle changes can help you prevent hypertension, and they may make you feel better overall and improve your quality of life. If you have hypertension, making these changes will help you control it and help prevent major complications, such as:  Hardening and narrowing of arteries that supply blood to: ? Your heart. This can cause a heart attack. ? Your brain. This can cause a stroke. ? Your kidneys. This can cause kidney failure.  Stress on your heart muscle, which can cause heart failure.  What can I do to lower my risk?  Work with your health care provider to make a hypertension prevention plan that works for you. Follow your plan and keep all follow-up visits as told by your health care provider.  Learn how to check your blood pressure at home. Make sure that you know your personal target blood pressure, as told by your health care  provider. How is this treated? In addition to diet and lifestyle changes, your health care provider may recommend medicines to help lower your blood pressure. You may need to try a few different medicines to find what works best for you. You also may need to take more than one medicine. Take over-the-counter and prescription medicines only as told by your health care provider. Where to find support: Your health care provider can help you prevent hypertension and help you keep your blood pressure at a healthy level. Your local hospital or your community may also provide support services and prevention programs. The American Heart Association offers an online support network at: CheapBootlegs.com.cy Where to find more information: Learn more about hypertension from:  National Heart, Lung, and Blood Institute: ElectronicHangman.is  Centers for Disease Control and Prevention: https://ingram.com/  American Academy of Family Physicians: http://familydoctor.org/familydoctor/en/diseases-conditions/high-blood-pressure.printerview.all.html  Learn more about the DASH diet from:  [place] [place]: https://www.reyes.com/  Contact a health care provider if:  You think you are having a reaction to medicines you have taken.  You have recurrent headaches or feel dizzy.  You have swelling in your ankles.  You have trouble with your vision. Summary  Hypertension often does not cause any symptoms until blood pressure is very high. It is important to get your blood pressure checked regularly.  Diet and lifestyle changes are the most important steps in preventing hypertension.  By keeping your blood pressure in a healthy range, you can prevent complications like heart attack, heart failure, stroke, and kidney failure.  Work with your health care provider to make a hypertension  prevention plan that works for you. This information is not intended to replace advice given to you by your health care provider. Make sure you discuss any questions you have with your health care provider. Document Released: 03/04/2015 Document Revised: 10/29/2015 Document Reviewed: 10/29/2015 Elsevier Interactive Patient Education  2018 [place] Years, Female Preventive care refers to lifestyle choices and visits with your health care provider that can promote health and wellness. What does preventive care include?  A yearly physical exam. This is also called an annual well check.  Dental exams once or twice a year.  Routine eye exams. Ask your health care provider how often you should have your eyes checked.  Personal lifestyle choices, including: ? Daily care of your teeth and gums. ? Regular physical activity. ? Eating a healthy diet. ? Avoiding tobacco and drug use. ? Limiting alcohol use. ? Practicing safe sex. ? Taking low-dose aspirin daily starting at  age 79. ? Taking vitamin and mineral supplements as recommended by your health care provider. What happens during an annual well check? The services and screenings done by your health care provider during your annual well check will depend on your age, overall health, lifestyle risk factors, and family history of disease. Counseling Your health care provider may ask you questions about your:  Alcohol use.  Tobacco use.  Drug use.  Emotional well-being.  Home and relationship well-being.  Sexual activity.  Eating habits.  Work and work Statistician.  Method of birth control.  Menstrual cycle.  Pregnancy history.  Screening You may have the following tests or measurements:  Height, weight, and BMI.  Blood pressure.  Lipid and cholesterol levels. These may be checked every 5 years, or more frequently if you are over 21 years old.  Skin check.  Lung cancer screening. You may  have this screening every year starting at age 52 if you have a 30-pack-year history of smoking and currently smoke or have quit within the past 15 years.  Fecal occult blood test (FOBT) of the stool. You may have this test every year starting at age 6.  Flexible sigmoidoscopy or colonoscopy. You may have a sigmoidoscopy every 5 years or a colonoscopy every 10 years starting at age 8.  Hepatitis C blood test.  Hepatitis B blood test.  Sexually transmitted disease (STD) testing.  Diabetes screening. This is done by checking your blood sugar (glucose) after you have not eaten for a while (fasting). You may have this done every 1-3 years.  Mammogram. This may be done every 1-2 years. Talk to your health care provider about when you should start having regular mammograms. This may depend on whether you have a family history of breast cancer.  BRCA-related cancer screening. This may be done if you have a family history of breast, ovarian, tubal, or peritoneal cancers.  Pelvic exam and Pap test. This may be done every 3 years starting at age 55. Starting at age 72, this may be done every 5 years if you have a Pap test in combination with an HPV test.  Bone density scan. This is done to screen for osteoporosis. You may have this scan if you are at high risk for osteoporosis.  Discuss your test results, treatment options, and if necessary, the need for more tests with your health care provider. Vaccines Your health care provider may recommend certain vaccines, such as:  Influenza vaccine. This is recommended every year.  Tetanus, diphtheria, and acellular pertussis (Tdap, Td) vaccine. You may need a Td booster every 10 years.  Varicella vaccine. You may need this if you have not been vaccinated.  Zoster vaccine. You may need this after age 31.  Measles, mumps, and rubella (MMR) vaccine. You may need at least one dose of MMR if you were born in 1957 or later. You may also need a second  dose.  Pneumococcal 13-valent conjugate (PCV13) vaccine. You may need this if you have certain conditions and were not previously vaccinated.  Pneumococcal polysaccharide (PPSV23) vaccine. You may need one or two doses if you smoke cigarettes or if you have certain conditions.  Meningococcal vaccine. You may need this if you have certain conditions.  Hepatitis A vaccine. You may need this if you have certain conditions or if you travel or work in places where you may be exposed to hepatitis A.  Hepatitis B vaccine. You may need this if you have certain conditions or if you  travel or work in places where you may be exposed to hepatitis B.  Haemophilus influenzae type b (Hib) vaccine. You may need this if you have certain conditions.  Talk to your health care provider about which screenings and vaccines you need and how often you need them. This information is not intended to replace advice given to you by your health care provider. Make sure you discuss any questions you have with your health care provider. Document Released: 03/16/2015 Document Revised: 11/07/2015 Document Reviewed: 12/19/2014 Elsevier Interactive Patient Education  2018 [place] Years, Female Preventive care refers to lifestyle choices and visits with your health care provider that can promote health and wellness. What does preventive care include?  A yearly physical exam. This is also called an annual well check.  Dental exams once or twice a year.  Routine eye exams. Ask your health care provider how often you should have your eyes checked.  Personal lifestyle choices, including: ? Daily care of your teeth and gums. ? Regular physical activity. ? Eating a healthy diet. ? Avoiding tobacco and drug use. ? Limiting alcohol use. ? Practicing safe sex. ? Taking low-dose aspirin daily starting at age 13. ? Taking vitamin and mineral supplements as recommended by your health care  provider. What happens during an annual well check? The services and screenings done by your health care provider during your annual well check will depend on your age, overall health, lifestyle risk factors, and family history of disease. Counseling Your health care provider may ask you questions about your:  Alcohol use.  Tobacco use.  Drug use.  Emotional well-being.  Home and relationship well-being.  Sexual activity.  Eating habits.  Work and work Statistician.  Method of birth control.  Menstrual cycle.  Pregnancy history.  Screening You may have the following tests or measurements:  Height, weight, and BMI.  Blood pressure.  Lipid and cholesterol levels. These may be checked every 5 years, or more frequently if you are over 81 years old.  Skin check.  Lung cancer screening. You may have this screening every year starting at age 69 if you have a 30-pack-year history of smoking and currently smoke or have quit within the past 15 years.  Fecal occult blood test (FOBT) of the stool. You may have this test every year starting at age 37.  Flexible sigmoidoscopy or colonoscopy. You may have a sigmoidoscopy every 5 years or a colonoscopy every 10 years starting at age 60.  Hepatitis C blood test.  Hepatitis B blood test.  Sexually transmitted disease (STD) testing.  Diabetes screening. This is done by checking your blood sugar (glucose) after you have not eaten for a while (fasting). You may have this done every 1-3 years.  Mammogram. This may be done every 1-2 years. Talk to your health care provider about when you should start having regular mammograms. This may depend on whether you have a family history of breast cancer.  BRCA-related cancer screening. This may be done if you have a family history of breast, ovarian, tubal, or peritoneal cancers.  Pelvic exam and Pap test. This may be done every 3 years starting at age 66. Starting at age 61, this may be  done every 5 years if you have a Pap test in combination with an HPV test.  Bone density scan. This is done to screen for osteoporosis. You may have this scan if you are at high risk for osteoporosis.  Discuss your test  results, treatment options, and if necessary, the need for more tests with your health care provider. Vaccines Your health care provider may recommend certain vaccines, such as:  Influenza vaccine. This is recommended every year.  Tetanus, diphtheria, and acellular pertussis (Tdap, Td) vaccine. You may need a Td booster every 10 years.  Varicella vaccine. You may need this if you have not been vaccinated.  Zoster vaccine. You may need this after age 12.  Measles, mumps, and rubella (MMR) vaccine. You may need at least one dose of MMR if you were born in 1957 or later. You may also need a second dose.  Pneumococcal 13-valent conjugate (PCV13) vaccine. You may need this if you have certain conditions and were not previously vaccinated.  Pneumococcal polysaccharide (PPSV23) vaccine. You may need one or two doses if you smoke cigarettes or if you have certain conditions.  Meningococcal vaccine. You may need this if you have certain conditions.  Hepatitis A vaccine. You may need this if you have certain conditions or if you travel or work in places where you may be exposed to hepatitis A.  Hepatitis B vaccine. You may need this if you have certain conditions or if you travel or work in places where you may be exposed to hepatitis B.  Haemophilus influenzae type b (Hib) vaccine. You may need this if you have certain conditions.  Talk to your health care provider about which screenings and vaccines you need and how often you need them. This information is not intended to replace advice given to you by your health care provider. Make sure you discuss any questions you have with your health care provider. Document Released: 03/16/2015 Document Revised: 11/07/2015 Document  Reviewed: 12/19/2014 Elsevier Interactive Patient Education  2018 [place] Years, Female Preventive care refers to lifestyle choices and visits with your health care provider that can promote health and wellness. What does preventive care include?  A yearly physical exam. This is also called an annual well check.  Dental exams once or twice a year.  Routine eye exams. Ask your health care provider how often you should have your eyes checked.  Personal lifestyle choices, including: ? Daily care of your teeth and gums. ? Regular physical activity. ? Eating a healthy diet. ? Avoiding tobacco and drug use. ? Limiting alcohol use. ? Practicing safe sex. ? Taking low-dose aspirin daily starting at age 2. ? Taking vitamin and mineral supplements as recommended by your health care provider. What happens during an annual well check? The services and screenings done by your health care provider during your annual well check will depend on your age, overall health, lifestyle risk factors, and family history of disease. Counseling Your health care provider may ask you questions about your:  Alcohol use.  Tobacco use.  Drug use.  Emotional well-being.  Home and relationship well-being.  Sexual activity.  Eating habits.  Work and work Statistician.  Method of birth control.  Menstrual cycle.  Pregnancy history.  Screening You may have the following tests or measurements:  Height, weight, and BMI.  Blood pressure.  Lipid and cholesterol levels. These may be checked every 5 years, or more frequently if you are over 97 years old.  Skin check.  Lung cancer screening. You may have this screening every year starting at age 81 if you have a 30-pack-year history of smoking and currently smoke or have quit within the past 15 years.  Fecal occult blood test (FOBT) of the  stool. You may have this test every year starting at age 49.  Flexible  sigmoidoscopy or colonoscopy. You may have a sigmoidoscopy every 5 years or a colonoscopy every 10 years starting at age 37.  Hepatitis C blood test.  Hepatitis B blood test.  Sexually transmitted disease (STD) testing.  Diabetes screening. This is done by checking your blood sugar (glucose) after you have not eaten for a while (fasting). You may have this done every 1-3 years.  Mammogram. This may be done every 1-2 years. Talk to your health care provider about when you should start having regular mammograms. This may depend on whether you have a family history of breast cancer.  BRCA-related cancer screening. This may be done if you have a family history of breast, ovarian, tubal, or peritoneal cancers.  Pelvic exam and Pap test. This may be done every 3 years starting at age 59. Starting at age 19, this may be done every 5 years if you have a Pap test in combination with an HPV test.  Bone density scan. This is done to screen for osteoporosis. You may have this scan if you are at high risk for osteoporosis.  Discuss your test results, treatment options, and if necessary, the need for more tests with your health care provider. Vaccines Your health care provider may recommend certain vaccines, such as:  Influenza vaccine. This is recommended every year.  Tetanus, diphtheria, and acellular pertussis (Tdap, Td) vaccine. You may need a Td booster every 10 years.  Varicella vaccine. You may need this if you have not been vaccinated.  Zoster vaccine. You may need this after age 47.  Measles, mumps, and rubella (MMR) vaccine. You may need at least one dose of MMR if you were born in 1957 or later. You may also need a second dose.  Pneumococcal 13-valent conjugate (PCV13) vaccine. You may need this if you have certain conditions and were not previously vaccinated.  Pneumococcal polysaccharide (PPSV23) vaccine. You may need one or two doses if you smoke cigarettes or if you have certain  conditions.  Meningococcal vaccine. You may need this if you have certain conditions.  Hepatitis A vaccine. You may need this if you have certain conditions or if you travel or work in places where you may be exposed to hepatitis A.  Hepatitis B vaccine. You may need this if you have certain conditions or if you travel or work in places where you may be exposed to hepatitis B.  Haemophilus influenzae type b (Hib) vaccine. You may need this if you have certain conditions.  Talk to your health care provider about which screenings and vaccines you need and how often you need them. This information is not intended to replace advice given to you by your health care provider. Make sure you discuss any questions you have with your health care provider. Document Released: 03/16/2015 Document Revised: 11/07/2015 Document Reviewed: 12/19/2014 Elsevier Interactive Patient Education  2018 [employer].  Steps to Quit Smoking Smoking tobacco can be harmful to your health and can affect almost every organ in your body. Smoking puts you, and those around you, at risk for developing many serious chronic diseases. Quitting smoking is difficult, but it is one of the best things that you can do for your health. It is never too late to quit. What are the benefits of quitting smoking? When you quit smoking, you lower your risk of developing serious diseases and conditions, such as:  Lung cancer or lung disease,  such as COPD.  Heart disease.  Stroke.  Heart attack.  Infertility.  Osteoporosis and bone fractures.  Additionally, symptoms such as coughing, wheezing, and shortness of breath may get better when you quit. You may also find that you get sick less often because your body is stronger at fighting off colds and infections. If you are pregnant, quitting smoking can help to reduce your chances of having a baby of low birth weight. How do I get ready to quit? When you decide to quit smoking, create a  plan to make sure that you are successful. Before you quit:  Pick a date to quit. Set a date within the next two weeks to give you time to prepare.  Write down the reasons why you are quitting. Keep this list in places where you will see it often, such as on your bathroom mirror or in your car or wallet.  Identify the people, places, things, and activities that make you want to smoke (triggers) and avoid them. Make sure to take these actions: ? Throw away all cigarettes at home, at work, and in your car. ? Throw away smoking accessories, such as Scientist, research (medical). ? Clean your car and make sure to empty the ashtray. ? Clean your home, including curtains and carpets.  Tell your family, friends, and coworkers that you are quitting. Support from your loved ones can make quitting easier.  Talk with your health care provider about your options for quitting smoking.  Find out what treatment options are covered by your health insurance.  What strategies can I use to quit smoking? Talk with your healthcare provider about different strategies to quit smoking. Some strategies include:  Quitting smoking altogether instead of gradually lessening how much you smoke over a period of time. Research shows that quitting "cold [place]" is more successful than gradually quitting.  Attending in-person counseling to help you build problem-solving skills. You are more likely to have success in quitting if you attend several counseling sessions. Even short sessions of 10 minutes can be effective.  Finding resources and support systems that can help you to quit smoking and remain smoke-free after you quit. These resources are most helpful when you use them often. They can include: ? Online chats with a Social worker. ? Telephone quitlines. ? Careers information officer. ? Support groups or group counseling. ? Text messaging programs. ? Mobile phone applications.  Taking medicines to help you quit smoking.  (If you are pregnant or breastfeeding, talk with your health care provider first.) Some medicines contain nicotine and some do not. Both types of medicines help with cravings, but the medicines that include nicotine help to relieve withdrawal symptoms. Your health care provider may recommend: ? Nicotine patches, gum, or lozenges. ? Nicotine inhalers or sprays. ? Non-nicotine medicine that is taken by mouth.  Talk with your health care provider about combining strategies, such as taking medicines while you are also receiving in-person counseling. Using these two strategies together makes you more likely to succeed in quitting than if you used either strategy on its own. If you are pregnant or breastfeeding, talk with your health care provider about finding counseling or other support strategies to quit smoking. Do not take medicine to help you quit smoking unless told to do so by your health care provider. What things can I do to make it easier to quit? Quitting smoking might feel overwhelming at first, but there is a lot that you can do to make it easier. Take  these important actions:  Reach out to your family and friends and ask that they support and encourage you during this time. Call telephone quitlines, reach out to support groups, or work with a counselor for support.  Ask people who smoke to avoid smoking around you.  Avoid places that trigger you to smoke, such as bars, parties, or smoke-break areas at work.  Spend time around people who do not smoke.  Lessen stress in your life, because stress can be a smoking trigger for some people. To lessen stress, try: ? Exercising regularly. ? Deep-breathing exercises. ? Yoga. ? Meditating. ? Performing a body scan. This involves closing your eyes, scanning your body from head to toe, and noticing which parts of your body are particularly tense. Purposefully relax the muscles in those areas.  Download or purchase mobile phone or tablet apps  (applications) that can help you stick to your quit plan by providing reminders, tips, and encouragement. There are many free apps, such as QuitGuide from the [employer] Office manager for Disease Control and Prevention). You can find other support for quitting smoking (smoking cessation) through smokefree.gov and other websites.  How will I feel when I quit smoking? Within the first 24 hours of quitting smoking, you may start to feel some withdrawal symptoms. These symptoms are usually most noticeable 2-3 days after quitting, but they usually do not last beyond 2-3 weeks. Changes or symptoms that you might experience include:  Mood swings.  Restlessness, anxiety, or irritation.  Difficulty concentrating.  Dizziness.  Strong cravings for sugary foods in addition to nicotine.  Mild weight gain.  Constipation.  Nausea.  Coughing or a sore throat.  Changes in how your medicines work in your body.  A depressed mood.  Difficulty sleeping (insomnia).  After the first 2-3 weeks of quitting, you may start to notice more positive results, such as:  Improved sense of smell and taste.  Decreased coughing and sore throat.  Slower heart rate.  Lower blood pressure.  Clearer skin.  The ability to breathe more easily.  Fewer sick days.  Quitting smoking is very challenging for most people. Do not get discouraged if you are not successful the first time. Some people need to make many attempts to quit before they achieve long-term success. Do your best to stick to your quit plan, and talk with your health care provider if you have any questions or concerns. This information is not intended to replace advice given to you by your health care provider. Make sure you discuss any questions you have with your health care provider. Document Released: 02/11/2001 Document Revised: 10/16/2015 Document Reviewed: 07/04/2014 Elsevier Interactive Patient Education  [employer].

## 2017-08-25 NOTE — Progress Notes (Signed)
Subjective:  Ebony Wolfe is a 52 y.o. female who presents for basic physical exam. The patient presents for a health assessment as it is required by [facility] to keep insurance premiums low.  Patient denies any current health related concerns today.  Patient states she has not been to the doctor in several years.  Patient has a lot of concerns with her mother and father as she is their primary caregivers and this appears to be stress The patient appears to be under a lot of pressure from this situation.  The patient states her last Pap smear was about 3 years ago and that she has not had a recent mammogram.  She had states to the best of her knowledge her immunizations are up-to-date.  Patient denies any past medical history of heart disease, lung disease, kidney disease, liver disease, diabetes, hypertension, asthma, or seizures.  The patient states she does not take any medications on a daily basis, and does not have any allergies to any medications.  The patient has a surgical history of a C-section in 2004.  Patient is married and has 1 child.  The patient states [place] to this area.  The patient's child is healthy.  The patient does have a family history of hyperlipidemia and Alzheimer's in her father, who is currently alive, age 50.  The patient's mother has a history of clinical depression with suicidal ideations, and hypertension.  The patient's mother is alive, age 50.  The patient states she does not have any siblings.  The patient denies any recreational drug use, but has smoked since age 52, at least half a pack to 1 pack/day.  The patient also admits to drinking socially.   Social History   Tobacco Use  . Smoking status: Not on file  Substance Use Topics  . Alcohol use: Not on file  . Drug use: Not on file    No Known Allergies  Current Outpatient Medications  Medication Sig Dispense Refill  . ferrous sulfate 325 (65 FE) MG tablet Take 325 mg  by mouth daily with breakfast.     No current facility-administered medications for this visit.     Review of Systems  Constitutional: Negative.   HENT: Negative.   Eyes: Negative.   Respiratory: Negative.   Cardiovascular: Negative.   Gastrointestinal: Negative.   Genitourinary: Negative.   Musculoskeletal: Negative.   Skin: Negative.   Neurological: Negative.   Endo/Heme/Allergies: Negative.   Psychiatric/Behavioral: Negative.     Objective:  BP 90/65 (BP Location: Right Arm, Patient Position: Sitting, Cuff Size: Normal)   Pulse (!) 55   Temp 97.6 F (36.4 C) (Oral)   Resp 16   Wt 124 lb 9.6 oz (56.5 kg)   SpO2 97%   General Appearance:  Alert, cooperative, no distress, appears stated age  Head:  Normocephalic, without obvious abnormality, atraumatic  Eyes:  PERRL, conjunctiva/corneas clear, EOM's intact, fundi benign, both eyes  Ears:  Normal TM's and external ear canals, both ears  Nose: Nares normal, septum midline,mucosa normal, no drainage or sinus tenderness  Throat: Lips, mucosa, and tongue normal; teeth and gums normal  Neck: Supple, symmetrical, trachea midline, no adenopathy;  thyroid: not enlarged, symmetric, no tenderness/mass/nodules; no carotid bruit or JVD  Back:   Symmetric, no curvature, ROM normal, no CVA tenderness  Lungs:   Clear to auscultation bilaterally, respirations unlabored  Breasts:  Deferred  Heart:  Regular rate and rhythm, S1 and S2 normal,  no murmur, rub, or gallop  Abdomen:   Soft, non-tender, bowel sounds active all four quadrants,  no masses, no organomegaly  Pelvic: Deferred  Extremities: Extremities normal, atraumatic, no cyanosis or edema  Pulses: 2+ and symmetric  Skin: Skin color, texture, turgor normal, no rashes or lesions  Lymph nodes: Cervical, supraclavicular nodes normal  Neurologic: Normal     Assessment:  basic physical exam    Plan:  Patient education provided. Patient informs that she is scheduling an  appointment with a PCP to establish care.  Informed the patient that she will need a PAP smear, mammogram and other lab work at that time, may also need to consider medications for her mental health.  Patient also informed that she should consider a smoking cessation program when she is ready.  Patient did not want the number to the [facility] that was offered.  Patient was given education on health maintenance, health prevention for her age group, preventing hypertension and cholesterol.  Also further discussed the benefits of her wanting to quit and quitting smoking.  Patient verbalizes understanding and has no questions at time of discharge.

## 2018-01-19 DIAGNOSIS — R8290 Unspecified abnormal findings in urine: Secondary | ICD-10-CM | POA: Diagnosis not present

## 2018-01-19 DIAGNOSIS — Z720 Tobacco use: Secondary | ICD-10-CM | POA: Diagnosis not present

## 2018-01-19 DIAGNOSIS — B9789 Other viral agents as the cause of diseases classified elsewhere: Secondary | ICD-10-CM | POA: Diagnosis not present

## 2018-01-19 DIAGNOSIS — F39 Unspecified mood [affective] disorder: Secondary | ICD-10-CM | POA: Diagnosis not present

## 2018-01-19 DIAGNOSIS — F321 Major depressive disorder, single episode, moderate: Secondary | ICD-10-CM | POA: Diagnosis not present

## 2018-01-19 DIAGNOSIS — N951 Menopausal and female climacteric states: Secondary | ICD-10-CM | POA: Diagnosis not present

## 2018-01-19 DIAGNOSIS — J069 Acute upper respiratory infection, unspecified: Secondary | ICD-10-CM | POA: Diagnosis not present

## 2018-01-19 DIAGNOSIS — F4541 Pain disorder exclusively related to psychological factors: Secondary | ICD-10-CM | POA: Diagnosis not present

## 2018-01-19 DIAGNOSIS — R5383 Other fatigue: Secondary | ICD-10-CM | POA: Diagnosis not present

## 2018-01-21 ENCOUNTER — Other Ambulatory Visit: Payer: Self-pay | Admitting: Family Medicine

## 2018-01-21 DIAGNOSIS — Z1231 Encounter for screening mammogram for malignant neoplasm of breast: Secondary | ICD-10-CM

## 2018-02-09 DIAGNOSIS — Z720 Tobacco use: Secondary | ICD-10-CM | POA: Diagnosis not present

## 2018-02-09 DIAGNOSIS — F3341 Major depressive disorder, recurrent, in partial remission: Secondary | ICD-10-CM | POA: Diagnosis not present

## 2018-02-23 MED FILL — ESCITALOPRAM 10 MG TABLET: 10 | 90 days supply | Qty: 90 | Fill #0

## 2018-04-19 ENCOUNTER — Other Ambulatory Visit (HOSPITAL_COMMUNITY)
Admission: RE | Admit: 2018-04-19 | Discharge: 2018-04-19 | Disposition: A | Discharge status: Home / Self Care | Payer: 59 | Source: Ambulatory Visit | Attending: Obstetrics and Gynecology | Admitting: Obstetrics and Gynecology

## 2018-04-19 ENCOUNTER — Other Ambulatory Visit: Payer: Self-pay | Admitting: Obstetrics and Gynecology

## 2018-04-19 DIAGNOSIS — N951 Menopausal and female climacteric states: Secondary | ICD-10-CM | POA: Diagnosis not present

## 2018-04-19 DIAGNOSIS — Z1239 Encounter for other screening for malignant neoplasm of breast: Secondary | ICD-10-CM | POA: Diagnosis not present

## 2018-04-19 DIAGNOSIS — N952 Postmenopausal atrophic vaginitis: Secondary | ICD-10-CM | POA: Diagnosis not present

## 2018-04-19 DIAGNOSIS — Z124 Encounter for screening for malignant neoplasm of cervix: Secondary | ICD-10-CM | POA: Insufficient documentation

## 2018-04-19 DIAGNOSIS — N941 Unspecified dyspareunia: Secondary | ICD-10-CM | POA: Diagnosis not present

## 2018-04-19 DIAGNOSIS — N898 Other specified noninflammatory disorders of vagina: Secondary | ICD-10-CM | POA: Diagnosis not present

## 2018-04-19 DIAGNOSIS — R61 Generalized hyperhidrosis: Secondary | ICD-10-CM | POA: Diagnosis not present

## 2018-04-20 ENCOUNTER — Ambulatory Visit
Admission: RE | Admit: 2018-04-20 | Discharge: 2018-04-20 | Disposition: A | Discharge status: Home / Self Care | Payer: 59 | Source: Ambulatory Visit | Attending: Family Medicine | Admitting: Family Medicine

## 2018-04-20 DIAGNOSIS — Z1231 Encounter for screening mammogram for malignant neoplasm of breast: Secondary | ICD-10-CM | POA: Diagnosis not present

## 2018-04-21 LAB — CYTOLOGY - PAP
DIAGNOSIS: NEGATIVE
HPV: NOT DETECTED

## 2018-05-11 DIAGNOSIS — Z720 Tobacco use: Secondary | ICD-10-CM | POA: Diagnosis not present

## 2018-05-11 DIAGNOSIS — F3341 Major depressive disorder, recurrent, in partial remission: Secondary | ICD-10-CM | POA: Diagnosis not present

## 2018-05-13 MED FILL — ESCITALOPRAM 20 MG TABLET: 20 | 90 days supply | Qty: 90 | Fill #0

## 2018-05-17 DIAGNOSIS — H524 Presbyopia: Secondary | ICD-10-CM | POA: Diagnosis not present

## 2018-08-23 DIAGNOSIS — Z0000 Encounter for general adult medical examination without abnormal findings: Secondary | ICD-10-CM | POA: Diagnosis not present

## 2018-08-23 DIAGNOSIS — M79601 Pain in right arm: Secondary | ICD-10-CM | POA: Diagnosis not present

## 2018-08-23 DIAGNOSIS — Z1211 Encounter for screening for malignant neoplasm of colon: Secondary | ICD-10-CM | POA: Diagnosis not present

## 2018-08-23 DIAGNOSIS — Z13220 Encounter for screening for lipoid disorders: Secondary | ICD-10-CM | POA: Diagnosis not present

## 2018-08-23 DIAGNOSIS — Z23 Encounter for immunization: Secondary | ICD-10-CM | POA: Diagnosis not present

## 2018-08-23 DIAGNOSIS — Z720 Tobacco use: Secondary | ICD-10-CM | POA: Diagnosis not present

## 2018-08-23 DIAGNOSIS — F321 Major depressive disorder, single episode, moderate: Secondary | ICD-10-CM | POA: Diagnosis not present

## 2018-08-23 MED FILL — ESCITALOPRAM 20 MG TABLET: 20 | 90 days supply | Qty: 90 | Fill #0

## 2018-08-23 MED FILL — DICLOFENAC SOD EC 50 MG TAB: 50 | 30 days supply | Qty: 60 | Fill #0

## 2018-08-31 DIAGNOSIS — Z1211 Encounter for screening for malignant neoplasm of colon: Secondary | ICD-10-CM | POA: Diagnosis not present

## 2019-01-18 MED FILL — AMOXICILLIN 500 MG CAPSULE: 500 | 7 days supply | Qty: 30 | Fill #0

## 2019-01-18 MED FILL — IBUPROFEN 800 MG TABS: 800 | 3 days supply | Qty: 12 | Fill #0

## 2019-02-14 MED FILL — IBUPROFEN 800 MG TABS: 800 | 3 days supply | Qty: 12 | Fill #1

## 2019-04-06 MED FILL — ESCITALOPRAM 20 MG TABLET: 20 | 90 days supply | Qty: 90 | Fill #0

## 2019-09-30 DIAGNOSIS — Z720 Tobacco use: Secondary | ICD-10-CM | POA: Diagnosis not present

## 2019-09-30 DIAGNOSIS — F321 Major depressive disorder, single episode, moderate: Secondary | ICD-10-CM | POA: Diagnosis not present

## 2019-09-30 DIAGNOSIS — Z0000 Encounter for general adult medical examination without abnormal findings: Secondary | ICD-10-CM | POA: Diagnosis not present

## 2019-09-30 DIAGNOSIS — Z13220 Encounter for screening for lipoid disorders: Secondary | ICD-10-CM | POA: Diagnosis not present

## 2019-09-30 DIAGNOSIS — Z1211 Encounter for screening for malignant neoplasm of colon: Secondary | ICD-10-CM | POA: Diagnosis not present

## 2019-09-30 DIAGNOSIS — N951 Menopausal and female climacteric states: Secondary | ICD-10-CM | POA: Diagnosis not present

## 2019-09-30 DIAGNOSIS — L989 Disorder of the skin and subcutaneous tissue, unspecified: Secondary | ICD-10-CM | POA: Diagnosis not present

## 2019-09-30 MED FILL — buPROPion HCL ER (XL) 150 M: 150 | 30 days supply | Qty: 30 | Fill #0

## 2019-12-09 IMAGING — MG DIGITAL SCREENING BILATERAL MAMMOGRAM WITH TOMO AND CAD
8 series · 9 of 24 positions shown · non-contrast
Comparison: None.

CLINICAL DATA: Screening.

EXAM:
DIGITAL SCREENING BILATERAL MAMMOGRAM WITH TOMO AND CAD

[R CC synth-2D]
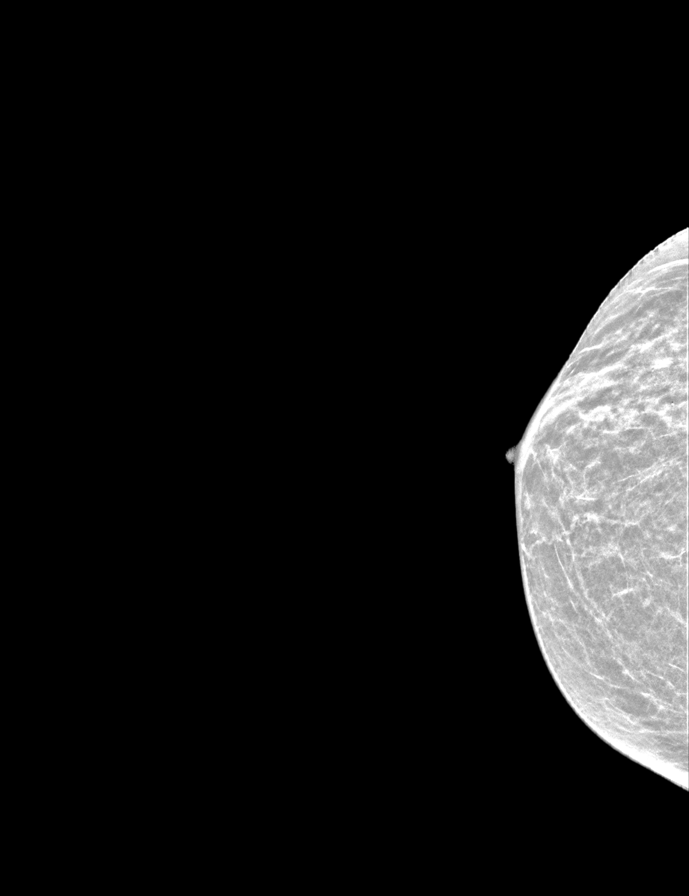

[L CC synth-2D]
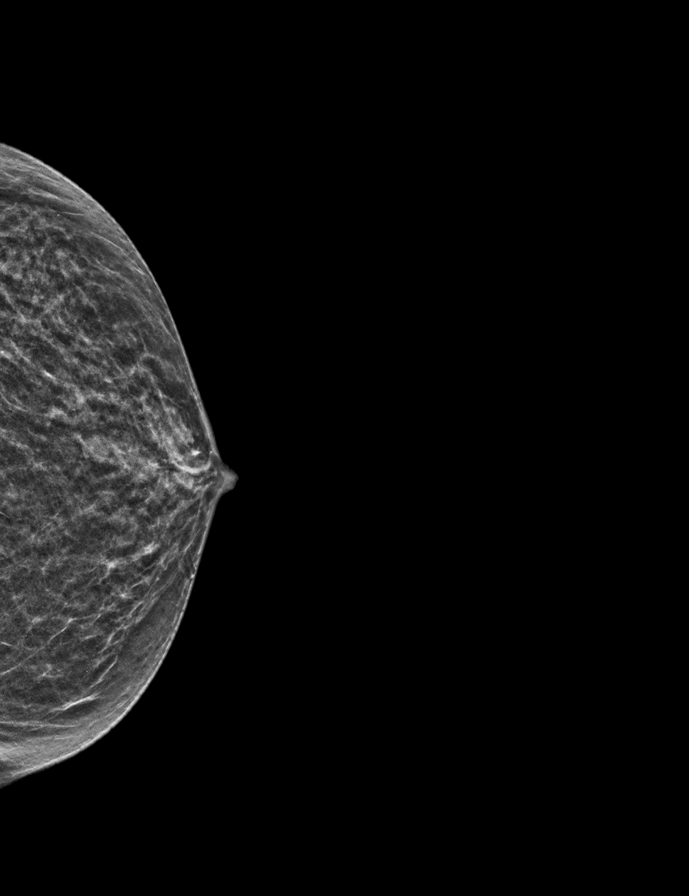

[R MLO synth-2D]
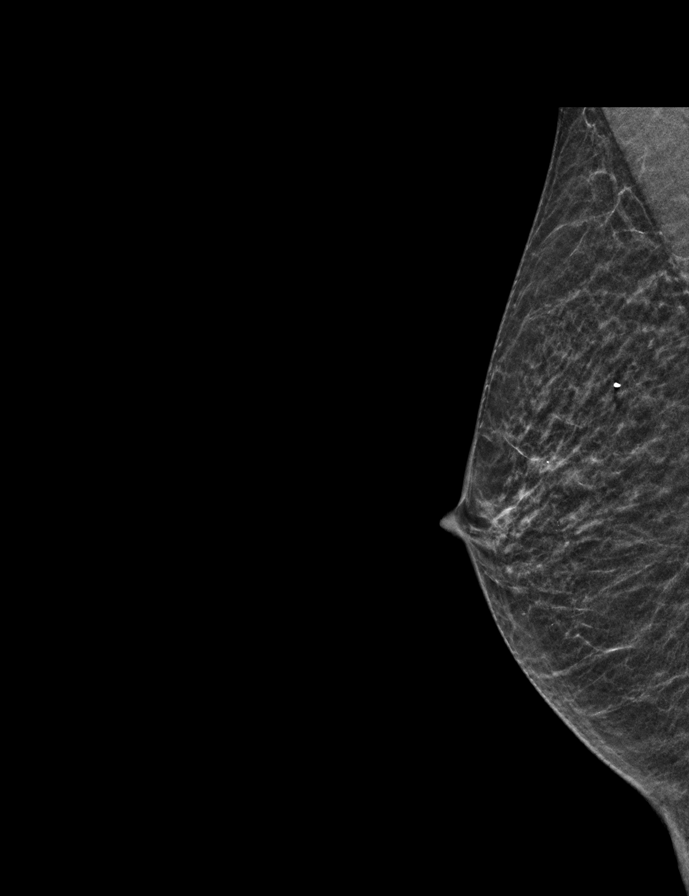

[L MLO synth-2D]
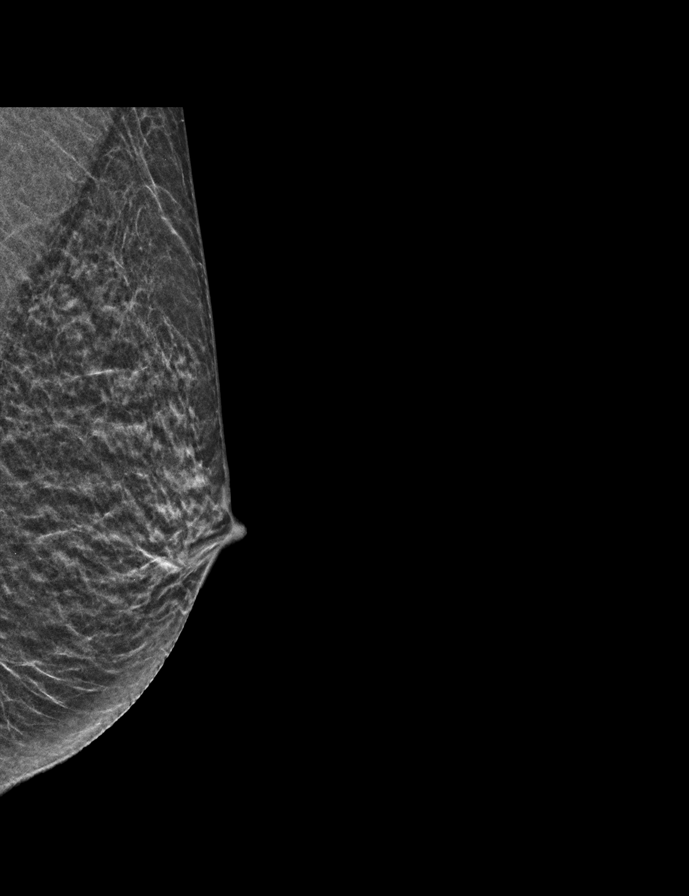

[L CC tomo · 2 of 32 frames shown]
[frame 11/32]
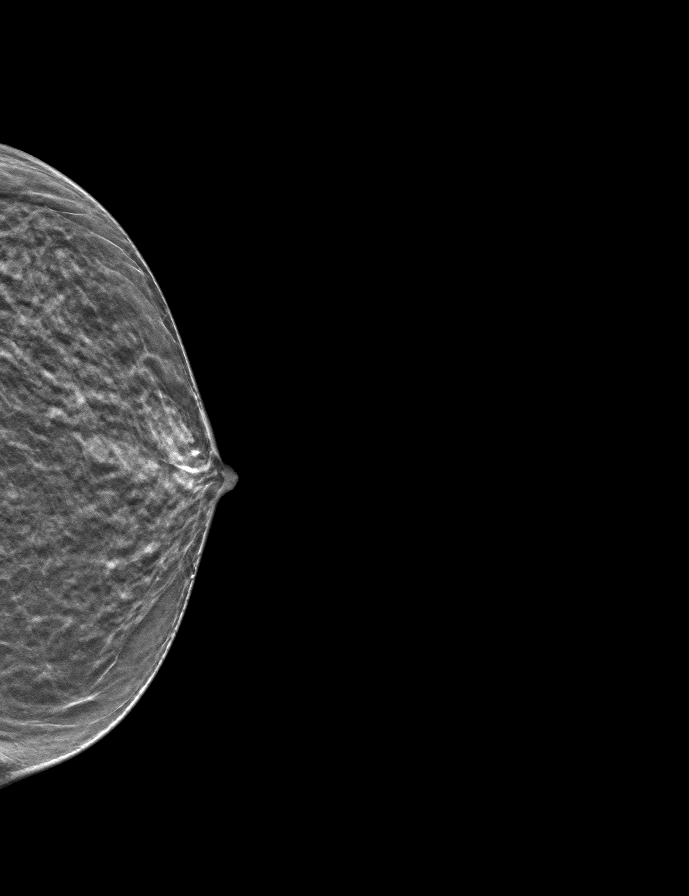
[frame 17/32]
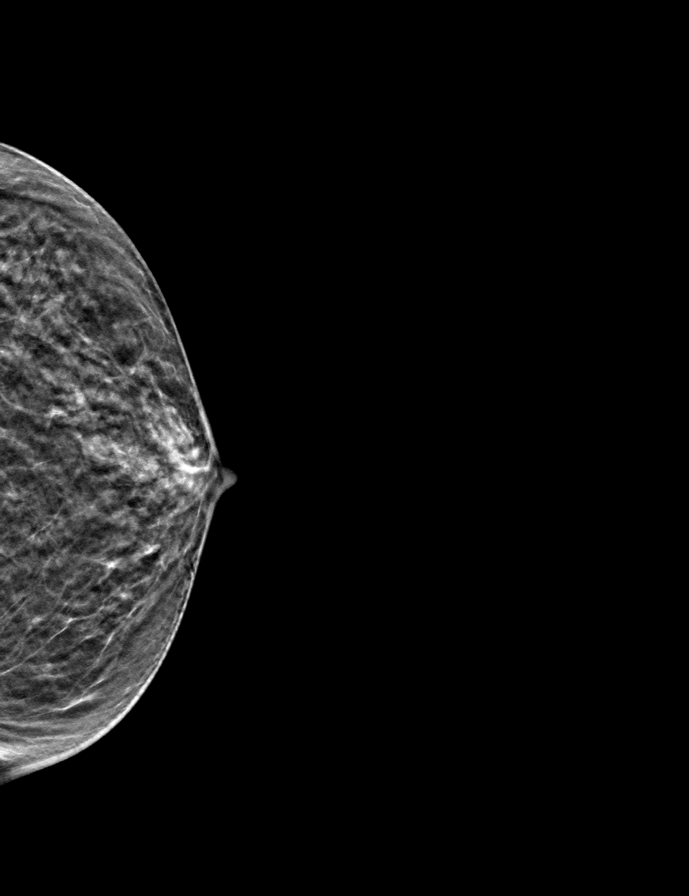

[R CC tomo · tomo slice 16/31.0]
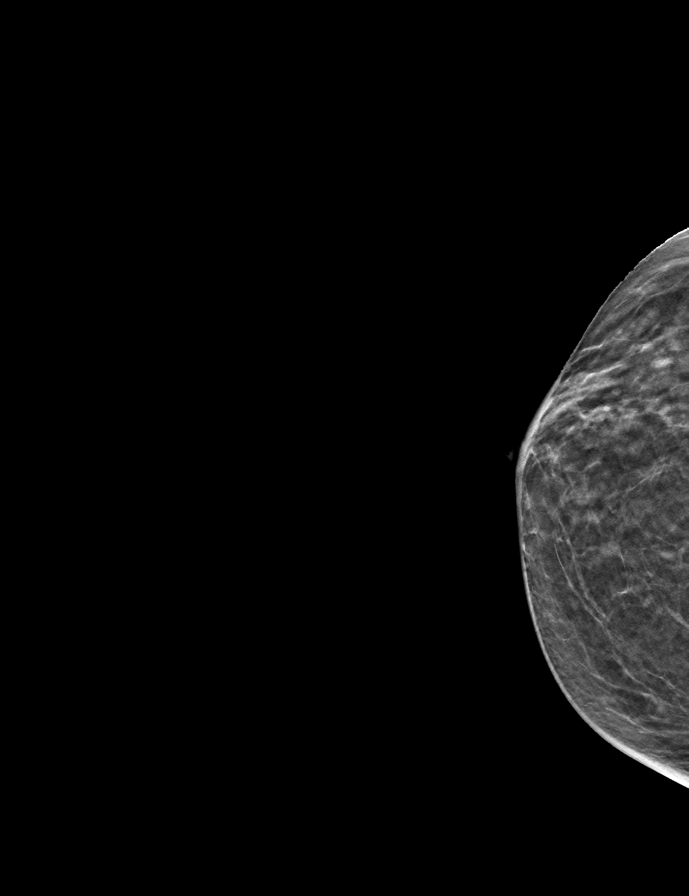

[L MLO tomo · tomo slice 17/32.0]
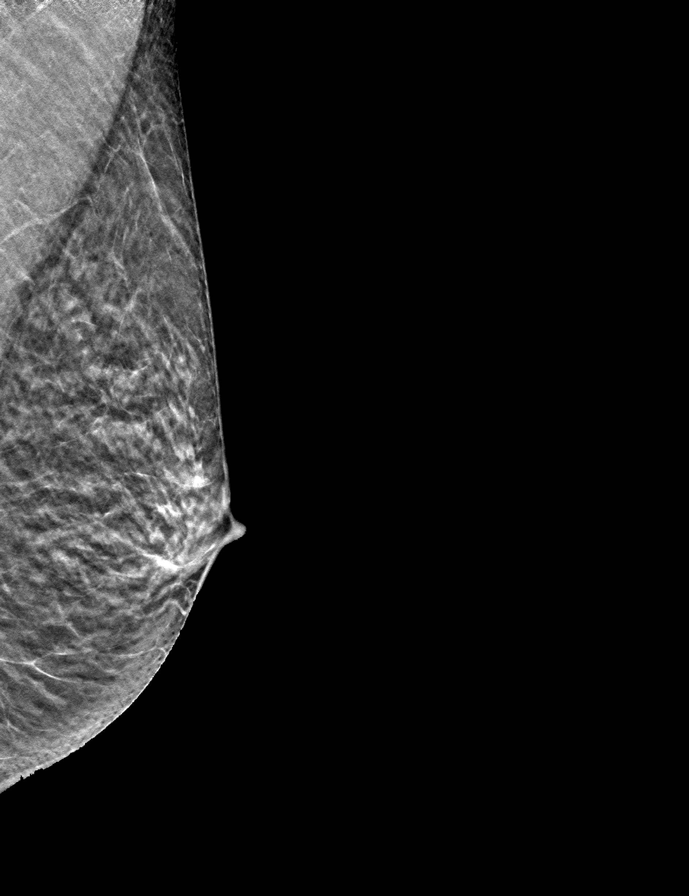

[R MLO tomo · tomo slice 16/31.0]
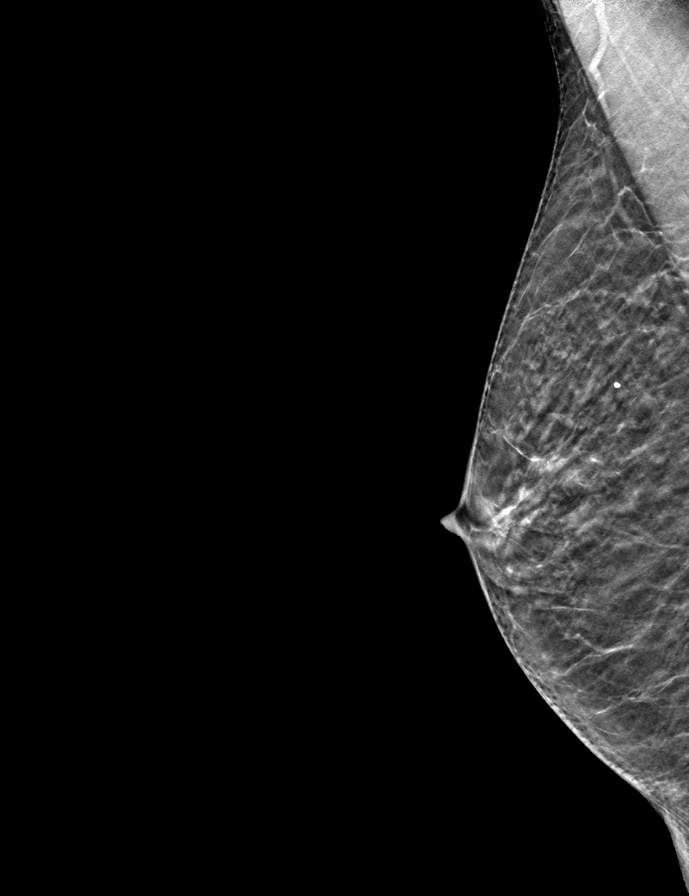

[9 of 24 positions shown; findings below may reference images not displayed]

ACR Breast Density Category c: The breast tissue is heterogeneously
dense, which may obscure small masses
FINDINGS: There are no findings suspicious for malignancy. Images were
processed with CAD.
IMPRESSION: No mammographic evidence of malignancy. A result letter of this
screening mammogram will be mailed directly to the patient.

RECOMMENDATION:
Screening mammogram in one year. (Code:EM-2-IHY)

BI-RADS CATEGORY  1: Negative.

## 2020-01-05 ENCOUNTER — Other Ambulatory Visit (HOSPITAL_COMMUNITY): Payer: Self-pay | Admitting: Family Medicine

## 2020-01-05 MED FILL — buPROPion HCL ER (XL) 150 M: 150 | 30 days supply | Qty: 30 | Fill #0

## 2020-01-05 MED FILL — ESCITALOPRAM 20 MG TABLET: 20 | 30 days supply | Qty: 30 | Fill #0

## 2020-01-13 ENCOUNTER — Other Ambulatory Visit (HOSPITAL_COMMUNITY): Payer: Self-pay | Admitting: Family Medicine

## 2020-01-13 DIAGNOSIS — Z720 Tobacco use: Secondary | ICD-10-CM | POA: Diagnosis not present

## 2020-01-13 DIAGNOSIS — F321 Major depressive disorder, single episode, moderate: Secondary | ICD-10-CM | POA: Diagnosis not present

## 2020-01-13 DIAGNOSIS — F419 Anxiety disorder, unspecified: Secondary | ICD-10-CM | POA: Diagnosis not present

## 2020-01-13 MED FILL — hydrOXYzine HCL 10 MG TABS: 10 | 15 days supply | Qty: 90 | Fill #0

## 2020-02-17 ENCOUNTER — Other Ambulatory Visit (HOSPITAL_COMMUNITY): Payer: Self-pay | Admitting: Family Medicine

## 2020-02-17 DIAGNOSIS — Z720 Tobacco use: Secondary | ICD-10-CM | POA: Diagnosis not present

## 2020-02-17 DIAGNOSIS — F321 Major depressive disorder, single episode, moderate: Secondary | ICD-10-CM | POA: Diagnosis not present

## 2020-02-17 DIAGNOSIS — F419 Anxiety disorder, unspecified: Secondary | ICD-10-CM | POA: Diagnosis not present

## 2020-02-17 MED FILL — buPROPion HCL ER (XL) 150 M: 150 | 90 days supply | Qty: 90 | Fill #0

## 2020-02-17 MED FILL — ESCITALOPRAM 20 MG TABLET: 20 | 90 days supply | Qty: 90 | Fill #0

## 2020-05-18 DIAGNOSIS — F4541 Pain disorder exclusively related to psychological factors: Secondary | ICD-10-CM | POA: Diagnosis not present

## 2020-05-18 DIAGNOSIS — F321 Major depressive disorder, single episode, moderate: Secondary | ICD-10-CM | POA: Diagnosis not present

## 2020-05-18 DIAGNOSIS — Z720 Tobacco use: Secondary | ICD-10-CM | POA: Diagnosis not present

## 2020-05-18 DIAGNOSIS — F419 Anxiety disorder, unspecified: Secondary | ICD-10-CM | POA: Diagnosis not present

## 2020-05-18 DIAGNOSIS — Z1211 Encounter for screening for malignant neoplasm of colon: Secondary | ICD-10-CM | POA: Diagnosis not present

## 2020-06-19 ENCOUNTER — Other Ambulatory Visit (HOSPITAL_COMMUNITY): Payer: Self-pay

## 2020-06-19 MED ORDER — ESCITALOPRAM OXALATE 20 MG PO TABS
20.0000 mg | ORAL_TABLET | Freq: Every day | ORAL | 0 refills | Status: DC
  Filled 2020-06-19 – 2020-07-05 (×3): qty 90, 90d supply, fill #0

## 2020-06-19 MED ORDER — BUPROPION HCL ER (XL) 150 MG PO TB24
150.0000 mg | ORAL_TABLET | Freq: Every morning | ORAL | 1 refills | Status: DC
  Filled 2020-06-19 – 2020-07-05 (×2): qty 90, 90d supply, fill #0

## 2020-06-27 ENCOUNTER — Other Ambulatory Visit (HOSPITAL_COMMUNITY): Payer: Self-pay

## 2020-07-05 ENCOUNTER — Other Ambulatory Visit (HOSPITAL_COMMUNITY): Payer: Self-pay

## 2020-07-06 ENCOUNTER — Other Ambulatory Visit (HOSPITAL_COMMUNITY): Payer: Self-pay

## 2020-07-16 ENCOUNTER — Other Ambulatory Visit (HOSPITAL_COMMUNITY): Payer: Self-pay

## 2020-10-02 ENCOUNTER — Other Ambulatory Visit (HOSPITAL_COMMUNITY): Payer: Self-pay

## 2020-10-02 DIAGNOSIS — Z5320 Procedure and treatment not carried out because of patient's decision for unspecified reasons: Secondary | ICD-10-CM | POA: Diagnosis not present

## 2020-10-02 DIAGNOSIS — F419 Anxiety disorder, unspecified: Secondary | ICD-10-CM | POA: Diagnosis not present

## 2020-10-02 DIAGNOSIS — Z720 Tobacco use: Secondary | ICD-10-CM | POA: Diagnosis not present

## 2020-10-02 DIAGNOSIS — Z7185 Encounter for immunization safety counseling: Secondary | ICD-10-CM | POA: Diagnosis not present

## 2020-10-02 DIAGNOSIS — Z0000 Encounter for general adult medical examination without abnormal findings: Secondary | ICD-10-CM | POA: Diagnosis not present

## 2020-10-02 DIAGNOSIS — D696 Thrombocytopenia, unspecified: Secondary | ICD-10-CM | POA: Diagnosis not present

## 2020-10-02 DIAGNOSIS — Z1231 Encounter for screening mammogram for malignant neoplasm of breast: Secondary | ICD-10-CM | POA: Diagnosis not present

## 2020-10-02 DIAGNOSIS — F321 Major depressive disorder, single episode, moderate: Secondary | ICD-10-CM | POA: Diagnosis not present

## 2020-10-02 DIAGNOSIS — Z13220 Encounter for screening for lipoid disorders: Secondary | ICD-10-CM | POA: Diagnosis not present

## 2020-10-02 MED ORDER — HYDROXYZINE HCL 10 MG PO TABS
10.0000 mg | ORAL_TABLET | Freq: Three times a day (TID) | ORAL | 1 refills | Status: DC | PRN
  Filled 2020-10-02: qty 90, 15d supply, fill #0

## 2020-10-02 MED ORDER — BUPROPION HCL ER (XL) 150 MG PO TB24
150.0000 mg | ORAL_TABLET | Freq: Every morning | ORAL | 1 refills | Status: DC
  Filled 2020-10-02: qty 90, 90d supply, fill #0

## 2020-10-02 MED ORDER — ESCITALOPRAM OXALATE 20 MG PO TABS
20.0000 mg | ORAL_TABLET | Freq: Every day | ORAL | 1 refills | Status: DC
  Filled 2020-10-02 (×2): qty 45, 45d supply, fill #0

## 2020-10-08 ENCOUNTER — Other Ambulatory Visit: Payer: Self-pay | Admitting: Family Medicine

## 2020-12-06 ENCOUNTER — Other Ambulatory Visit (HOSPITAL_COMMUNITY): Payer: Self-pay

## 2020-12-06 DIAGNOSIS — M7918 Myalgia, other site: Secondary | ICD-10-CM | POA: Diagnosis not present

## 2020-12-06 DIAGNOSIS — S39012A Strain of muscle, fascia and tendon of lower back, initial encounter: Secondary | ICD-10-CM | POA: Diagnosis not present

## 2020-12-06 MED ORDER — MELOXICAM 15 MG PO TABS
15.0000 mg | ORAL_TABLET | Freq: Every day | ORAL | 1 refills | Status: DC
  Filled 2020-12-06: qty 30, 30d supply, fill #0

## 2020-12-06 MED ORDER — TIZANIDINE HCL 4 MG PO TABS
4.0000 mg | ORAL_TABLET | Freq: Three times a day (TID) | ORAL | 0 refills | Status: DC | PRN
  Filled 2020-12-06: qty 30, 10d supply, fill #0

## 2021-04-04 ENCOUNTER — Other Ambulatory Visit (HOSPITAL_COMMUNITY): Payer: Self-pay

## 2021-04-04 DIAGNOSIS — F321 Major depressive disorder, single episode, moderate: Secondary | ICD-10-CM | POA: Diagnosis not present

## 2021-04-04 DIAGNOSIS — F4541 Pain disorder exclusively related to psychological factors: Secondary | ICD-10-CM | POA: Diagnosis not present

## 2021-04-04 MED ORDER — BUPROPION HCL ER (XL) 150 MG PO TB24
150.0000 mg | ORAL_TABLET | Freq: Every day | ORAL | 1 refills | Status: DC
  Filled 2021-04-04: qty 90, 90d supply, fill #0

## 2021-04-04 MED ORDER — ESCITALOPRAM OXALATE 20 MG PO TABS
20.0000 mg | ORAL_TABLET | Freq: Every day | ORAL | 1 refills | Status: DC
  Filled 2021-04-04: qty 90, 90d supply, fill #0
  Filled 2022-03-10: qty 90, 90d supply, fill #1

## 2021-07-17 DIAGNOSIS — H40013 Open angle with borderline findings, low risk, bilateral: Secondary | ICD-10-CM | POA: Diagnosis not present

## 2021-07-17 DIAGNOSIS — H2513 Age-related nuclear cataract, bilateral: Secondary | ICD-10-CM | POA: Diagnosis not present

## 2021-10-09 DIAGNOSIS — Z0000 Encounter for general adult medical examination without abnormal findings: Secondary | ICD-10-CM | POA: Diagnosis not present

## 2021-10-09 DIAGNOSIS — Z13220 Encounter for screening for lipoid disorders: Secondary | ICD-10-CM | POA: Diagnosis not present

## 2021-10-09 DIAGNOSIS — F321 Major depressive disorder, single episode, moderate: Secondary | ICD-10-CM | POA: Diagnosis not present

## 2021-10-09 DIAGNOSIS — L989 Disorder of the skin and subcutaneous tissue, unspecified: Secondary | ICD-10-CM | POA: Diagnosis not present

## 2021-10-09 DIAGNOSIS — D649 Anemia, unspecified: Secondary | ICD-10-CM | POA: Diagnosis not present

## 2021-10-09 DIAGNOSIS — Z1211 Encounter for screening for malignant neoplasm of colon: Secondary | ICD-10-CM | POA: Diagnosis not present

## 2021-10-09 DIAGNOSIS — D696 Thrombocytopenia, unspecified: Secondary | ICD-10-CM | POA: Diagnosis not present

## 2021-10-10 ENCOUNTER — Other Ambulatory Visit (HOSPITAL_COMMUNITY): Payer: Self-pay

## 2021-10-10 MED ORDER — ESCITALOPRAM OXALATE 20 MG PO TABS
20.0000 mg | ORAL_TABLET | Freq: Every day | ORAL | 1 refills | Status: DC
  Filled 2021-10-10: qty 90, 90d supply, fill #0
  Filled 2022-07-17: qty 90, 90d supply, fill #1

## 2021-10-10 MED ORDER — BUPROPION HCL ER (XL) 150 MG PO TB24
150.0000 mg | ORAL_TABLET | Freq: Every morning | ORAL | 1 refills | Status: DC
  Filled 2021-10-10: qty 90, 90d supply, fill #0
  Filled 2022-03-10 – 2022-07-17 (×2): qty 90, 90d supply, fill #1

## 2022-03-10 ENCOUNTER — Other Ambulatory Visit (HOSPITAL_COMMUNITY): Payer: Self-pay

## 2022-03-11 ENCOUNTER — Other Ambulatory Visit (HOSPITAL_COMMUNITY): Payer: Self-pay

## 2022-03-13 ENCOUNTER — Other Ambulatory Visit (HOSPITAL_COMMUNITY): Payer: Self-pay

## 2022-03-24 ENCOUNTER — Other Ambulatory Visit (HOSPITAL_COMMUNITY): Payer: Self-pay

## 2022-04-22 ENCOUNTER — Other Ambulatory Visit (HOSPITAL_COMMUNITY): Payer: Self-pay

## 2022-04-22 MED ORDER — AMOXICILLIN 875 MG PO TABS
875.0000 mg | ORAL_TABLET | Freq: Two times a day (BID) | ORAL | 0 refills | Status: DC
  Filled 2022-04-22: qty 20, 10d supply, fill #0

## 2022-07-17 ENCOUNTER — Other Ambulatory Visit (HOSPITAL_COMMUNITY): Payer: Self-pay

## 2022-07-18 ENCOUNTER — Other Ambulatory Visit (HOSPITAL_COMMUNITY): Payer: Self-pay

## 2022-11-07 ENCOUNTER — Other Ambulatory Visit (HOSPITAL_COMMUNITY): Payer: Self-pay

## 2022-11-10 ENCOUNTER — Other Ambulatory Visit (HOSPITAL_COMMUNITY): Payer: Self-pay

## 2022-11-17 ENCOUNTER — Other Ambulatory Visit (HOSPITAL_COMMUNITY): Payer: Self-pay

## 2022-11-17 ENCOUNTER — Other Ambulatory Visit: Payer: Self-pay | Admitting: Family Medicine

## 2022-11-17 DIAGNOSIS — Z0000 Encounter for general adult medical examination without abnormal findings: Secondary | ICD-10-CM | POA: Diagnosis not present

## 2022-11-17 DIAGNOSIS — Z5320 Procedure and treatment not carried out because of patient's decision for unspecified reasons: Secondary | ICD-10-CM | POA: Diagnosis not present

## 2022-11-17 DIAGNOSIS — F419 Anxiety disorder, unspecified: Secondary | ICD-10-CM | POA: Diagnosis not present

## 2022-11-17 DIAGNOSIS — F4541 Pain disorder exclusively related to psychological factors: Secondary | ICD-10-CM | POA: Diagnosis not present

## 2022-11-17 DIAGNOSIS — Z720 Tobacco use: Secondary | ICD-10-CM | POA: Diagnosis not present

## 2022-11-17 DIAGNOSIS — R1011 Right upper quadrant pain: Secondary | ICD-10-CM | POA: Diagnosis not present

## 2022-11-17 DIAGNOSIS — H9313 Tinnitus, bilateral: Secondary | ICD-10-CM | POA: Diagnosis not present

## 2022-11-17 DIAGNOSIS — F339 Major depressive disorder, recurrent, unspecified: Secondary | ICD-10-CM | POA: Diagnosis not present

## 2022-11-17 MED ORDER — BUPROPION HCL ER (XL) 150 MG PO TB24
150.0000 mg | ORAL_TABLET | Freq: Every morning | ORAL | 1 refills | Status: AC
  Filled 2022-11-17: qty 90, 90d supply, fill #0
  Filled 2023-03-06: qty 90, 90d supply, fill #1

## 2022-11-17 MED ORDER — HYDROXYZINE HCL 10 MG PO TABS
10.0000 mg | ORAL_TABLET | Freq: Three times a day (TID) | ORAL | 1 refills | Status: AC | PRN
  Filled 2022-11-17: qty 270, 90d supply, fill #0

## 2022-11-17 MED ORDER — TIZANIDINE HCL 4 MG PO TABS
4.0000 mg | ORAL_TABLET | Freq: Every day | ORAL | 1 refills | Status: AC | PRN
  Filled 2022-11-17: qty 90, 90d supply, fill #0

## 2022-11-17 MED ORDER — MELOXICAM 15 MG PO TABS
15.0000 mg | ORAL_TABLET | Freq: Every day | ORAL | 1 refills | Status: AC | PRN
  Filled 2022-11-17: qty 30, 30d supply, fill #0

## 2022-11-17 MED ORDER — ESCITALOPRAM OXALATE 20 MG PO TABS
20.0000 mg | ORAL_TABLET | Freq: Every day | ORAL | 1 refills | Status: DC
  Filled 2022-11-17: qty 90, 90d supply, fill #0
  Filled 2023-03-06: qty 90, 90d supply, fill #1

## 2022-11-19 ENCOUNTER — Ambulatory Visit
Admission: RE | Admit: 2022-11-19 | Discharge: 2022-11-19 | Disposition: A | Discharge status: Home / Self Care | Payer: Commercial Managed Care - PPO | Source: Ambulatory Visit | Attending: Family Medicine | Admitting: Family Medicine

## 2022-11-19 DIAGNOSIS — R1011 Right upper quadrant pain: Secondary | ICD-10-CM | POA: Diagnosis not present

## 2022-11-19 DIAGNOSIS — K769 Liver disease, unspecified: Secondary | ICD-10-CM | POA: Diagnosis not present

## 2023-03-06 ENCOUNTER — Other Ambulatory Visit (HOSPITAL_COMMUNITY): Payer: Self-pay

## 2023-05-19 ENCOUNTER — Other Ambulatory Visit (HOSPITAL_COMMUNITY): Payer: Self-pay

## 2023-05-19 ENCOUNTER — Other Ambulatory Visit: Payer: Self-pay

## 2023-05-19 DIAGNOSIS — F419 Anxiety disorder, unspecified: Secondary | ICD-10-CM | POA: Diagnosis not present

## 2023-05-19 DIAGNOSIS — R1011 Right upper quadrant pain: Secondary | ICD-10-CM | POA: Diagnosis not present

## 2023-05-19 DIAGNOSIS — Z720 Tobacco use: Secondary | ICD-10-CM | POA: Diagnosis not present

## 2023-05-19 DIAGNOSIS — F3341 Major depressive disorder, recurrent, in partial remission: Secondary | ICD-10-CM | POA: Diagnosis not present

## 2023-05-19 DIAGNOSIS — H9313 Tinnitus, bilateral: Secondary | ICD-10-CM | POA: Diagnosis not present

## 2023-05-19 MED ORDER — ESCITALOPRAM OXALATE 10 MG PO TABS
10.0000 mg | ORAL_TABLET | Freq: Every day | ORAL | 1 refills | Status: AC
  Filled 2023-05-19 – 2024-02-19 (×2): qty 90, 90d supply, fill #0

## 2023-05-28 ENCOUNTER — Telehealth: Payer: Self-pay | Admitting: Audiology

## 2023-05-29 ENCOUNTER — Ambulatory Visit
Admission: EM | Admit: 2023-05-29 | Discharge: 2023-05-29 | Disposition: A | Discharge status: Home / Self Care | Attending: Family Medicine | Admitting: Family Medicine

## 2023-05-29 ENCOUNTER — Other Ambulatory Visit (HOSPITAL_COMMUNITY): Payer: Self-pay

## 2023-05-29 DIAGNOSIS — K047 Periapical abscess without sinus: Secondary | ICD-10-CM | POA: Diagnosis not present

## 2023-05-29 HISTORY — DX: Depression, unspecified: F32.A

## 2023-05-29 MED ORDER — AMOXICILLIN-POT CLAVULANATE 875-125 MG PO TABS
1.0000 | ORAL_TABLET | Freq: Two times a day (BID) | ORAL | 0 refills | Status: AC
Start: 2023-05-29 — End: ?

## 2023-05-29 MED ORDER — IBUPROFEN 800 MG PO TABS: 800.0000 mg | ORAL_TABLET | Freq: Three times a day (TID) | ORAL | 0 refills | Status: AC

## 2023-05-29 NOTE — ED Provider Notes (Signed)
 [facility]  Note:  This document was prepared using Conservation officer, historic buildings and may include unintentional dictation errors.  MRN: [id number] DOB: [birth year]  Subjective:   Ebony Wolfe is a 58 y.o. female presenting for 1 week history of persistent and worsening left upper dental pain, oral pain.  Patient reports known history of significant dental issues.  Was previously seeing a Designer, industrial/product but unfortunately could not afford the dental care and procedures.  Denies any particular fever, difficulty eating or drinking.  However, she has started to notice facial swelling as well.  No history of CKD.  No overt chest pain, nausea, vomiting, sweats, fever.  No current facility-administered medications for this encounter.  Current Outpatient Medications:    buPROPion (WELLBUTRIN XL) 150 MG 24 hr tablet, Take 1 tablet (150 mg total) by mouth in the morning., Disp: 90 tablet, Rfl: 1   escitalopram (LEXAPRO) 10 MG tablet, Take 1 tablet (10 mg total) by mouth daily., Disp: 90 tablet, Rfl: 1   ferrous sulfate 325 (65 FE) MG tablet, Take 325 mg by mouth daily with breakfast., Disp: , Rfl:    hydrOXYzine (ATARAX) 10 MG tablet, Take 1-2 tablets (10-20 mg total) by mouth every 8 (eight) hours as needed for anxiety., Disp: 270 tablet, Rfl: 1   meloxicam (MOBIC) 15 MG tablet, Take 1 tablet (15 mg total) by mouth daily as needed., Disp: 30 tablet, Rfl: 1   tiZANidine (ZANAFLEX) 4 MG tablet, Take 1 tablet (4 mg total) by mouth daily as needed., Disp: 90 tablet, Rfl: 1   No Known Allergies  Past Medical History:  Diagnosis Date   Depression      Past Surgical History:  Procedure Laterality Date   CESAREAN SECTION      History reviewed. No pertinent family history.  Social History   Tobacco Use   Smoking status: Every Day    Types: Cigarettes   Smokeless tobacco: Never  Vaping Use   Vaping status: Never Used  Substance Use Topics   Alcohol use: Yes     Comment: occasionally   Drug use: Never    ROS   Objective:   Vitals: BP 139/77 (BP Location: Right Arm)   Pulse (!) 56   Temp 98.7 F (37.1 C) (Oral)   Resp 16   Ht 5\' 7"  (1.702 m)   Wt 155 lb (70.3 kg)   LMP  (LMP Unknown)   SpO2 96%   BMI 24.28 kg/m   Physical Exam Constitutional:      General: She is not in acute distress.    Appearance: Normal appearance. She is well-developed. She is not ill-appearing, toxic-appearing or diaphoretic.  HENT:     Head: Normocephalic and atraumatic.     Nose: Nose normal.     Mouth/Throat:     Mouth: Mucous membranes are moist.     Comments: Significant gingival recession throughout, multiple molars missing upper and lower.  Left molar outlined with significant tenderness, gingival erythema and swelling.  There is corresponding 1+ facial swelling on the same left side overlying this area. Eyes:     General: No scleral icterus.       Right eye: No discharge.        Left eye: No discharge.     Extraocular Movements: Extraocular movements intact.  Cardiovascular:     Rate and Rhythm: Normal rate.  Pulmonary:     Effort: Pulmonary effort is normal.  Skin:    General: Skin  is warm and dry.  Neurological:     General: No focal deficit present.     Mental Status: She is alert and oriented to person, place, and time.  Psychiatric:        Mood and Affect: Mood normal.        Behavior: Behavior normal.     Assessment and Plan :   PDMP not reviewed this encounter.  1. Dental abscess    Start Augmentin for dental infection/abscess, use ibuprofen for pain and inflammation. Emphasized need for dental surgeon consult. Counseled patient on potential for adverse effects with medications prescribed/recommended today, strict ER and return-to-clinic precautions discussed, patient verbalized understanding.    Wallis Bamberg, [place] 05/29/23 1807

## 2023-05-29 NOTE — Discharge Instructions (Addendum)
[facility] in [place] Address: [address], [place], [place] [address] Phone: [phone]  [place] [phone] extension [phone] [address].  Dr. Donn Pierini [phone] [place] [phone] [address] [facility].  Rescue mission [phone] extension [phone] N. [address] [place], [place], [address] First come first serve for the first 10 clients.  May do simple extractions only, no wisdom teeth or surgery.  You may try the second for Thursday of the month starting at [place] [facility] You may call the school to see if they are still helping to provide dental care for emergent cases.

## 2023-05-29 NOTE — ED Triage Notes (Signed)
 Patient here today with c/o left upper dental pain X 1 week. She has been taking Tylenol and Excedrin with some relief.

## 2023-06-08 DIAGNOSIS — Z1211 Encounter for screening for malignant neoplasm of colon: Secondary | ICD-10-CM | POA: Diagnosis not present

## 2023-06-08 DIAGNOSIS — R109 Unspecified abdominal pain: Secondary | ICD-10-CM | POA: Diagnosis not present

## 2023-06-18 ENCOUNTER — Ambulatory Visit: Admitting: Audiology

## 2023-06-30 DIAGNOSIS — Z1211 Encounter for screening for malignant neoplasm of colon: Secondary | ICD-10-CM | POA: Diagnosis not present

## 2023-07-16 ENCOUNTER — Ambulatory Visit: Admitting: Audiology

## 2023-10-20 ENCOUNTER — Other Ambulatory Visit (HOSPITAL_COMMUNITY): Payer: Self-pay

## 2023-10-20 MED ORDER — ESCITALOPRAM OXALATE 10 MG PO TABS
10.0000 mg | ORAL_TABLET | Freq: Every day | ORAL | 1 refills | Status: AC
  Filled 2023-10-20 – 2023-11-23 (×2): qty 90, 90d supply, fill #0

## 2023-10-20 MED ORDER — BUPROPION HCL ER (XL) 150 MG PO TB24
150.0000 mg | ORAL_TABLET | ORAL | 1 refills | Status: AC
  Filled 2023-10-20 – 2023-11-23 (×2): qty 90, 90d supply, fill #0
  Filled 2024-02-19: qty 90, 90d supply, fill #1

## 2023-10-20 MED ORDER — MELOXICAM 15 MG PO TABS
15.0000 mg | ORAL_TABLET | Freq: Every day | ORAL | 1 refills | Status: AC | PRN
  Filled 2023-10-20 – 2023-11-23 (×2): qty 30, 30d supply, fill #0

## 2023-11-03 ENCOUNTER — Other Ambulatory Visit (HOSPITAL_COMMUNITY): Payer: Self-pay

## 2023-11-24 ENCOUNTER — Other Ambulatory Visit (HOSPITAL_COMMUNITY): Payer: Self-pay

## 2023-12-17 ENCOUNTER — Other Ambulatory Visit: Payer: Self-pay | Admitting: Family Medicine

## 2023-12-17 DIAGNOSIS — Z13220 Encounter for screening for lipoid disorders: Secondary | ICD-10-CM | POA: Diagnosis not present

## 2023-12-17 DIAGNOSIS — M7918 Myalgia, other site: Secondary | ICD-10-CM | POA: Diagnosis not present

## 2023-12-17 DIAGNOSIS — F3341 Major depressive disorder, recurrent, in partial remission: Secondary | ICD-10-CM | POA: Diagnosis not present

## 2023-12-17 DIAGNOSIS — Z5320 Procedure and treatment not carried out because of patient's decision for unspecified reasons: Secondary | ICD-10-CM | POA: Diagnosis not present

## 2023-12-17 DIAGNOSIS — F419 Anxiety disorder, unspecified: Secondary | ICD-10-CM | POA: Diagnosis not present

## 2023-12-17 DIAGNOSIS — D696 Thrombocytopenia, unspecified: Secondary | ICD-10-CM | POA: Diagnosis not present

## 2023-12-17 DIAGNOSIS — Z0000 Encounter for general adult medical examination without abnormal findings: Secondary | ICD-10-CM | POA: Diagnosis not present

## 2023-12-17 DIAGNOSIS — Z1231 Encounter for screening mammogram for malignant neoplasm of breast: Secondary | ICD-10-CM | POA: Diagnosis not present

## 2023-12-17 DIAGNOSIS — R5383 Other fatigue: Secondary | ICD-10-CM | POA: Diagnosis not present

## 2023-12-17 DIAGNOSIS — Z720 Tobacco use: Secondary | ICD-10-CM | POA: Diagnosis not present

## 2024-01-01 ENCOUNTER — Ambulatory Visit
Admission: RE | Admit: 2024-01-01 | Discharge: 2024-01-01 | Disposition: A | Discharge status: Home / Self Care | Source: Ambulatory Visit | Attending: Family Medicine | Admitting: Family Medicine

## 2024-01-01 DIAGNOSIS — Z1231 Encounter for screening mammogram for malignant neoplasm of breast: Secondary | ICD-10-CM

## 2024-01-06 ENCOUNTER — Other Ambulatory Visit: Payer: Self-pay | Admitting: Family Medicine

## 2024-01-06 DIAGNOSIS — R928 Other abnormal and inconclusive findings on diagnostic imaging of breast: Secondary | ICD-10-CM

## 2024-01-16 ENCOUNTER — Ambulatory Visit
Admission: RE | Admit: 2024-01-16 | Discharge: 2024-01-16 | Disposition: A | Source: Ambulatory Visit | Attending: Family Medicine

## 2024-01-16 ENCOUNTER — Ambulatory Visit
Admission: RE | Admit: 2024-01-16 | Discharge: 2024-01-16 | Disposition: A | Discharge status: Home / Self Care | Source: Ambulatory Visit | Attending: Family Medicine | Admitting: Family Medicine

## 2024-01-16 DIAGNOSIS — R928 Other abnormal and inconclusive findings on diagnostic imaging of breast: Secondary | ICD-10-CM

## 2024-01-16 DIAGNOSIS — N6012 Diffuse cystic mastopathy of left breast: Secondary | ICD-10-CM | POA: Diagnosis not present

## 2024-02-19 ENCOUNTER — Other Ambulatory Visit (HOSPITAL_COMMUNITY): Payer: Self-pay

## 2024-04-08 ENCOUNTER — Other Ambulatory Visit: Payer: Self-pay | Admitting: Family Medicine

## 2024-04-08 ENCOUNTER — Other Ambulatory Visit (HOSPITAL_COMMUNITY): Payer: Self-pay

## 2024-04-08 ENCOUNTER — Encounter (HOSPITAL_COMMUNITY): Payer: Self-pay

## 2024-04-08 DIAGNOSIS — N6320 Unspecified lump in the left breast, unspecified quadrant: Secondary | ICD-10-CM

## 2024-04-08 MED ORDER — ESCITALOPRAM OXALATE 20 MG PO TABS
20.0000 mg | ORAL_TABLET | Freq: Every day | ORAL | 0 refills | Status: AC
  Filled 2024-04-08: qty 90, 90d supply, fill #0
